# Patient Record
Sex: Male | Born: 1995 | Race: Black or African American | Hispanic: No | Marital: Single | Smoking: Current some day smoker
Health system: Southern US, Community
[De-identification: ages and names within clinical notes are randomized; demographics above are authoritative.]

## PROBLEM LIST (undated history)

## (undated) HISTORY — PX: FOOT SURGERY: SHX648

---
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
## 2017-05-14 ENCOUNTER — Encounter (HOSPITAL_COMMUNITY): Payer: Self-pay | Admitting: Emergency Medicine

## 2017-05-14 ENCOUNTER — Emergency Department (HOSPITAL_COMMUNITY): Payer: Managed Care, Other (non HMO)

## 2017-05-14 ENCOUNTER — Emergency Department (HOSPITAL_COMMUNITY)
Admission: EM | Admit: 2017-05-14 | Discharge: 2017-05-14 | Disposition: A | Discharge status: Home / Self Care | Payer: Managed Care, Other (non HMO) | Attending: Emergency Medicine | Admitting: Emergency Medicine

## 2017-05-14 DIAGNOSIS — R079 Chest pain, unspecified: Secondary | ICD-10-CM | POA: Insufficient documentation

## 2017-05-14 DIAGNOSIS — F17200 Nicotine dependence, unspecified, uncomplicated: Secondary | ICD-10-CM | POA: Insufficient documentation

## 2017-05-14 NOTE — ED Notes (Signed)
Pt updated on plan of care,  

## 2017-05-14 NOTE — ED Triage Notes (Signed)
Pt c/o upper pain to chest that is sharp, hurts worse with deep breathing. Started coughing today that is dry per pt.

## 2017-05-24 NOTE — ED Provider Notes (Signed)
[facility]-EMERGENCY DEPT Provider Note   CSN: [id number] Arrival date & time: 05/14/17  1827     History   Chief Complaint Chief Complaint  Patient presents with  . Chest Pain    HPI Donald Miranda is a 21 y.o. male.  20ym with CP. Gradual onset a couple days ago. Sharp. Brie.f lasts seconds. Worse with deep breathing and coughing. Doesn't feel sob though. Cough nonproductive. No fever or chills. No unusual leg pain or swelling. hasn't noticed positional changes. Denies hx of pe/dvt.    Chest Pain      History reviewed. No pertinent past medical history.  There are no active problems to display for this patient.   History reviewed. No pertinent surgical history.     Home Medications    Prior to Admission medications   Medication Sig Start Date End Date Taking? Authorizing Provider  acetaminophen-codeine (TYLENOL #3) 300-30 MG per tablet Take 1-2 tablets by mouth every 6 (six) hours as needed for moderate pain. 09/17/14   Ivery QualeBryant, Hobson, PA-C  amoxicillin (AMOXIL) 500 MG capsule Take 1 capsule (500 mg total) by mouth 3 (three) times daily. 09/17/14   Ivery QualeBryant, Hobson, PA-C  sodium-potassium bicarbonate (ALKA-SELTZER GOLD) TBEF dissolvable tablet Take 2 tablets by mouth daily as needed (aches and pain).    [provider]    Family History History reviewed. No pertinent family history.  Social History Social History  Substance Use Topics  . Smoking status: Current Every Day Smoker  . Smokeless tobacco: Never Used  . Alcohol use No     Allergies   Patient has no known allergies.   Review of Systems Review of Systems  Cardiovascular: Positive for chest pain.     Physical Exam Updated Vital Signs BP 119/61 (BP Location: Right Arm)   Pulse (!) 53   Temp 97.7 F (36.5 C) (Oral)   Resp 16   SpO2 100%   Physical Exam  Constitutional: He appears well-developed and well-nourished. No distress.  HENT:  Head: Normocephalic and atraumatic.    Eyes: Conjunctivae are normal. Right eye exhibits no discharge. Left eye exhibits no discharge.  Neck: Neck supple.  Cardiovascular: Normal rate, regular rhythm and normal heart sounds.  Exam reveals no gallop and no friction rub.   No murmur heard. Pulmonary/Chest: Effort normal and breath sounds normal. No respiratory distress.  Abdominal: Soft. He exhibits no distension. There is no tenderness.  Musculoskeletal: He exhibits no edema or tenderness.  Lower extremities symmetric as compared to each other. No calf tenderness. Negative Homan's. No palpable cords.   Neurological: He is alert.  Skin: Skin is warm and dry.  Psychiatric: He has a normal mood and affect. His behavior is normal. Thought content normal.  Nursing note and vitals reviewed.    ED Treatments / Results  Labs (all labs ordered are listed, but only abnormal results are displayed) Labs Reviewed - No data to display  EKG  EKG Interpretation  Date/Time:  Tuesday May 14 2017 18:35:35 EDT Ventricular Rate:  47 PR Interval:  140 QRS Duration: 102 QT Interval:  410 QTC Calculation: 362 R Axis:   88 Text Interpretation:  Sinus bradycardia Otherwise normal ECG Confirmed by Juleen ChinaKOHUT  MD, Jeannett SeniorSTEPHEN ([id number]) on 05/14/2017 8:53:23 PM       Radiology No results found.  Procedures Procedures (including critical care time)  Medications Ordered in ED Medications - No data to display   Initial Impression / Assessment and Plan / ED Course  I have reviewed  the triage vital signs and the nursing notes.  Pertinent labs & imaging results that were available during my care of the patient were reviewed by me and considered in my medical decision making (see chart for details).     20ym with cp. I doubt pe, acs, dissection or other emergent process. Atypical for acs. Not tachy/tachypneic or signs/symptoms of dvt. No fever. Denies dyspnea.   Final Clinical Impressions(s) / ED Diagnoses   Final diagnoses:  Chest pain,  unspecified type    New Prescriptions Discharge Medication List as of 05/14/2017  8:54 PM       Raeford Razor, MD 05/24/17 1127

## 2017-06-03 ENCOUNTER — Encounter (HOSPITAL_COMMUNITY): Payer: Self-pay | Admitting: Emergency Medicine

## 2017-06-03 ENCOUNTER — Emergency Department (HOSPITAL_COMMUNITY)
Admission: EM | Admit: 2017-06-03 | Discharge: 2017-06-03 | Disposition: A | Discharge status: Home / Self Care | Payer: Self-pay | Attending: Emergency Medicine | Admitting: Emergency Medicine

## 2017-06-03 DIAGNOSIS — F17200 Nicotine dependence, unspecified, uncomplicated: Secondary | ICD-10-CM | POA: Insufficient documentation

## 2017-06-03 DIAGNOSIS — J029 Acute pharyngitis, unspecified: Secondary | ICD-10-CM | POA: Insufficient documentation

## 2017-06-03 MED ORDER — MAGIC MOUTHWASH W/LIDOCAINE: 5.0000 mL | Freq: Three times a day (TID) | ORAL | 0 refills | Status: DC | PRN

## 2017-06-03 MED ORDER — AMOXICILLIN 500 MG PO CAPS: 500.0000 mg | ORAL_CAPSULE | Freq: Three times a day (TID) | ORAL | 0 refills | Status: DC

## 2017-06-03 NOTE — ED Notes (Signed)
Pt states understanding of care given and follow up instructions.  Pt a/o ambulated from ED with steady gait 

## 2017-06-03 NOTE — ED Triage Notes (Signed)
Patient complaining of sore throat x 4 days.

## 2017-06-03 NOTE — ED Provider Notes (Signed)
[facility]-EMERGENCY DEPT Provider Note   CSN: [id number] Arrival date & time: 06/03/17  1718     History   Chief Complaint Chief Complaint  Patient presents with  . Sore Throat    HPI Donald Miranda is a 21 y.o. male.  HPI   Donald Miranda is a 21 y.o. male who presents to the Emergency Department complaining of sore throat for 4 days.  He describes pain with swallowing and "white patches and bumps" to the back of his throat.  His symptoms are associated with generalized body aches.  He denies fever, abd pain, cough, and rash.  He has not tried any therapies or medications prior to arrival.  History reviewed. No pertinent past medical history.  There are no active problems to display for this patient.   History reviewed. No pertinent surgical history.     Home Medications    Prior to Admission medications   Medication Sig Start Date End Date Taking? Authorizing Provider  acetaminophen-codeine (TYLENOL #3) 300-30 MG per tablet Take 1-2 tablets by mouth every 6 (six) hours as needed for moderate pain. 09/17/14   Ivery QualeBryant, Hobson, PA-C  amoxicillin (AMOXIL) 500 MG capsule Take 1 capsule (500 mg total) by mouth 3 (three) times daily. 06/03/17   Bea Duren, PA-C  magic mouthwash w/lidocaine SOLN Take 5 mLs by mouth 3 (three) times daily as needed for mouth pain. Swish and spit, do not swallow 06/03/17   Saleema Weppler, PA-C  sodium-potassium bicarbonate (ALKA-SELTZER GOLD) TBEF dissolvable tablet Take 2 tablets by mouth daily as needed (aches and pain).    [provider]    Family History History reviewed. No pertinent family history.  Social History Social History  Substance Use Topics  . Smoking status: Current Every Day Smoker  . Smokeless tobacco: Never Used  . Alcohol use Yes     Comment: occasionally     Allergies   Patient has no known allergies.   Review of Systems Review of Systems  Constitutional: Negative for activity change, appetite  change, chills and fever.  HENT: Positive for sore throat. Negative for congestion, ear pain, facial swelling, trouble swallowing and voice change.   Eyes: Negative for pain and visual disturbance.  Respiratory: Negative for cough and shortness of breath.   Gastrointestinal: Negative for abdominal pain, nausea and vomiting.  Musculoskeletal: Negative for arthralgias, neck pain and neck stiffness.  Skin: Negative for color change and rash.  Neurological: Negative for dizziness, facial asymmetry, speech difficulty, numbness and headaches.  Hematological: Negative for adenopathy.  All other systems reviewed and are negative.    Physical Exam Updated Vital Signs BP 125/70 (BP Location: Right Arm)   Pulse 69   Temp 98 F (36.7 C) (Oral)   Resp 16   Ht 6\' 2"  (1.88 m)   Wt 79.9 kg (176 lb 2 oz)   SpO2 100%   BMI 22.61 kg/m   Physical Exam  Constitutional: He is oriented to person, place, and time. He appears well-developed and well-nourished. No distress.  HENT:  Head: Normocephalic and atraumatic.  Right Ear: Tympanic membrane and ear canal normal.  Left Ear: Tympanic membrane and ear canal normal.  Mouth/Throat: Uvula is midline and mucous membranes are normal. No trismus in the jaw. No uvula swelling. Posterior oropharyngeal edema and posterior oropharyngeal erythema present. No oropharyngeal exudate or tonsillar abscesses.  Neck: Normal range of motion. Neck supple.  Cardiovascular: Normal rate, regular rhythm and normal heart sounds.   Pulmonary/Chest: Effort normal and  breath sounds normal.  Abdominal: Soft. He exhibits no distension. There is no splenomegaly. There is no tenderness.  Musculoskeletal: Normal range of motion.  Lymphadenopathy:    He has no cervical adenopathy.  Neurological: He is alert and oriented to person, place, and time. He exhibits normal muscle tone. Coordination normal.  Skin: Skin is warm and dry. No rash noted.  Nursing note and vitals  reviewed.    ED Treatments / Results  Labs (all labs ordered are listed, but only abnormal results are displayed) Labs Reviewed - No data to display  EKG  EKG Interpretation None       Radiology No results found.  Procedures Procedures (including critical care time)  Medications Ordered in ED Medications - No data to display   Initial Impression / Assessment and Plan / ED Course  I have reviewed the triage vital signs and the nursing notes.  Pertinent labs & imaging results that were available during my care of the patient were reviewed by me and considered in my medical decision making (see chart for details).     Pt non-toxic appearing.  Vitals stable.  Handles secretions well.  No concerning sx's for PTA.  Appears stable for d/c.  Agrees to tx plan and return precautions given.  Final Clinical Impressions(s) / ED Diagnoses   Final diagnoses:  Sore throat    New Prescriptions New Prescriptions   AMOXICILLIN (AMOXIL) 500 MG CAPSULE    Take 1 capsule (500 mg total) by mouth 3 (three) times daily.   MAGIC MOUTHWASH W/LIDOCAINE SOLN    Take 5 mLs by mouth 3 (three) times daily as needed for mouth pain. Swish and spit, do not swallow     Pauline Aus, PA-C 06/05/17 1538    Mesner, Barbara Cower, MD 06/06/17 [phone]

## 2017-06-03 NOTE — Discharge Instructions (Signed)
Drink plenty of fluids.  Take one benadryl capsule every 4-6 hrs as needed.

## 2019-07-20 ENCOUNTER — Encounter (HOSPITAL_COMMUNITY): Payer: Self-pay | Admitting: Emergency Medicine

## 2019-07-20 ENCOUNTER — Other Ambulatory Visit: Payer: Self-pay

## 2019-07-20 ENCOUNTER — Emergency Department (HOSPITAL_COMMUNITY)
Admission: EM | Admit: 2019-07-20 | Discharge: 2019-07-20 | Disposition: A | Discharge status: Home / Self Care | Payer: Self-pay | Attending: Emergency Medicine | Admitting: Emergency Medicine

## 2019-07-20 DIAGNOSIS — R51 Headache: Secondary | ICD-10-CM | POA: Insufficient documentation

## 2019-07-20 DIAGNOSIS — G8929 Other chronic pain: Secondary | ICD-10-CM | POA: Insufficient documentation

## 2019-07-20 DIAGNOSIS — F17210 Nicotine dependence, cigarettes, uncomplicated: Secondary | ICD-10-CM | POA: Insufficient documentation

## 2019-07-20 DIAGNOSIS — Z79899 Other long term (current) drug therapy: Secondary | ICD-10-CM | POA: Insufficient documentation

## 2019-07-20 NOTE — ED Triage Notes (Signed)
Pt reports he had to leave work due to a headache he has had for months  His mother told him not to take pills for headache every day  (ibuprofen 600 mg daily)  Was at work and had headache to R temporal area and into eye and jaw  Here for eval and work note

## 2019-07-20 NOTE — Discharge Instructions (Signed)
You were seen in the ED today for headaches for the last 4 years.  This may be related to taking ibuprofen daily.  You can have what is called a rebound headache.  It is recommended that you trying to stop taking any kind of pain medication to see if your headaches will resolve over time.  Please do not take more than the recommended daily amount of ibuprofen or Tylenol.  Is recommended that you take these with food if need be.  Please follow-up with your primary care physician.  If you do not have one he can follow-up with either the [facility] clinic or the [facility] here in town.  You can also follow-up with [facility] in [place].

## 2019-07-20 NOTE — ED Provider Notes (Signed)
[facility] EMERGENCY DEPARTMENT Provider Note   CSN: [id number] Arrival date & time: 07/20/19  1347    History   Chief Complaint Chief Complaint  Patient presents with  . Headache    HPI Donald Miranda is a 23 y.o. male with no pertinent past medical history who presents to the ED today complaining of intermittent right-sided headaches for the last 4 years.  He has been taking 600 mg ibuprofen daily for the headaches.  He states that sometimes this resolves his headache but other times he will go to sleep and wake up with the same headache.  Patient was at work today and began experiencing the same headache.  He reports he left work and came to the ED because he wanted to know what was causing his headaches.  Patient is concerned that he is taking so much ibuprofen as he read on [employer] that this is not good for him.  Patient states that he thought that his headaches were caused from smoking marijuana.  He was in jail for the last 11 months and states he did not smoke any marijuana during that.  But continued to have headaches.  States that he typically has photophobia with his headaches as well as nausea and sometimes vomiting.  He has never been evaluated for this in the past.  Patient reports that his headache is present currently but it is very mild as he has already taken 800 mg ibuprofen today.  Denies fever, chills, neck stiffness, rash, vision changes, unilateral weakness or numbness, speech difficulties.       History reviewed. No pertinent past medical history.  There are no active problems to display for this patient.   History reviewed. No pertinent surgical history.      Home Medications    Prior to Admission medications   Medication Sig Start Date End Date Taking? Authorizing Provider  acetaminophen-codeine (TYLENOL #3) 300-30 MG per tablet Take 1-2 tablets by mouth every 6 (six) hours as needed for moderate pain. 09/17/14   Lily Kocher, PA-C  amoxicillin  (AMOXIL) 500 MG capsule Take 1 capsule (500 mg total) by mouth 3 (three) times daily. 06/03/17   Triplett, Tammy, PA-C  magic mouthwash w/lidocaine SOLN Take 5 mLs by mouth 3 (three) times daily as needed for mouth pain. Swish and spit, do not swallow 06/03/17   Triplett, Tammy, PA-C  sodium-potassium bicarbonate (ALKA-SELTZER GOLD) TBEF dissolvable tablet Take 2 tablets by mouth daily as needed (aches and pain).    [provider]    Family History No family history on file.  Social History Social History   Tobacco Use  . Smoking status: Current Every Day Smoker  . Smokeless tobacco: Never Used  Substance Use Topics  . Alcohol use: Yes    Comment: occasionally  . Drug use: Yes    Types: Marijuana     Allergies   Patient has no known allergies.   Review of Systems Review of Systems  Constitutional: Negative for chills and fever.  Eyes: Positive for photophobia. Negative for pain and visual disturbance.  Gastrointestinal: Positive for nausea and vomiting.  Musculoskeletal: Negative for neck pain and neck stiffness.  Skin: Negative for rash.  Neurological: Positive for headaches. Negative for dizziness, weakness, light-headedness and numbness.     Physical Exam Updated Vital Signs BP 125/72 (BP Location: Right Arm)   Pulse 71   Temp 99.3 F (37.4 C) (Oral)   Resp (!) 66   Ht 6\' 1"  (1.854 m)  Wt 99.8 kg   SpO2 100%   BMI 29.03 kg/m   Physical Exam Vitals signs and nursing note reviewed.  Constitutional:      Appearance: He is not ill-appearing.  HENT:     Head: Normocephalic and atraumatic.  Eyes:     Conjunctiva/sclera: Conjunctivae normal.  Neck:     Musculoskeletal: Neck supple.     Comments: Negative Brudzinski's and Kernig sign. Cardiovascular:     Rate and Rhythm: Normal rate and regular rhythm.  Pulmonary:     Effort: Pulmonary effort is normal.     Breath sounds: Normal breath sounds. No wheezing, rhonchi or rales.  Abdominal:      Palpations: Abdomen is soft.     Tenderness: There is no abdominal tenderness.  Musculoskeletal: Normal range of motion.  Skin:    General: Skin is warm and dry.  Neurological:     Mental Status: He is alert.     Comments: CN 3-12 grossly intact A&O x4 GCS 15 Sensation and strength intact Gait nonataxic including with tandem walking Coordination with finger-to-nose WNL Neg romberg, neg pronator drift       ED Treatments / Results  Labs (all labs ordered are listed, but only abnormal results are displayed) Labs Reviewed - No data to display  EKG None  Radiology No results found.  Procedures Procedures (including critical care time)  Medications Ordered in ED Medications - No data to display   Initial Impression / Assessment and Plan / ED Course  I have reviewed the triage vital signs and the nursing notes.  Pertinent labs & imaging results that were available during my care of the patient were reviewed by me and considered in my medical decision making (see chart for details).    23 year old otherwise healthy male who presents with persistent headaches for the last 4 years.  Patient reports that he has this headache daily.  He takes about 600-800 mg ibuprofen daily and is concerned.  Patient had a headache earlier today and already took 800 mg ibuprofen.  His headaches may be related to taking analgesics every day.  Question rebound headache.  States his headache is still present but very mild in nature.  Do not feel he needs headache cocktail today.  He has no focal neuro deficits on exam.  No red flag symptoms to suggest intracranial process.  Patient able to ambulate without difficulty.  Normal finger-to-nose.  Negative pronator drift.  Do not feel patient needs any additional work-up today.  He will need to follow-up with primary care physician regarding his persistent headaches.  Will give resource for primary care follow-up today.  Patient advised to try to not take  ibuprofen or Tylenol for these headaches as this might be caused from rebound headaches.  Suggest he try not taking these for about 1 to 2 weeks and see if his headaches improve.  Strict return precautions discussed.  Patient is in agreement with plan.  Stable for discharge at this time      Final Clinical Impressions(s) / ED Diagnoses   Final diagnoses:  Chronic nonintractable headache, unspecified headache type    ED Discharge Orders    None       Tanda RockersVenter, Quinlee Sciarra, PA-C 07/20/19 2205    Eber HongMiller, Brian, MD 07/24/19 [phone]

## 2019-07-20 NOTE — ED Notes (Signed)
Pt is non photophobic   Ambulates heel to toe without stagger or drift   Is conversant and lucid

## 2019-07-20 NOTE — ED Notes (Signed)
PA at bedside.

## 2019-08-05 ENCOUNTER — Emergency Department (HOSPITAL_COMMUNITY): Payer: Self-pay

## 2019-08-05 ENCOUNTER — Emergency Department (HOSPITAL_COMMUNITY)
Admission: EM | Admit: 2019-08-05 | Discharge: 2019-08-05 | Disposition: A | Discharge status: Home / Self Care | Payer: Self-pay | Attending: Emergency Medicine | Admitting: Emergency Medicine

## 2019-08-05 ENCOUNTER — Encounter (HOSPITAL_COMMUNITY): Payer: Self-pay

## 2019-08-05 ENCOUNTER — Other Ambulatory Visit: Payer: Self-pay

## 2019-08-05 DIAGNOSIS — F17200 Nicotine dependence, unspecified, uncomplicated: Secondary | ICD-10-CM | POA: Insufficient documentation

## 2019-08-05 DIAGNOSIS — J069 Acute upper respiratory infection, unspecified: Secondary | ICD-10-CM | POA: Insufficient documentation

## 2019-08-05 DIAGNOSIS — Z20828 Contact with and (suspected) exposure to other viral communicable diseases: Secondary | ICD-10-CM | POA: Insufficient documentation

## 2019-08-05 LAB — SARS CORONAVIRUS 2 (TAT 6-24 HRS): SARS Coronavirus 2: NEGATIVE

## 2019-08-05 MED ORDER — NAPROXEN 500 MG PO TABS: 500.0000 mg | ORAL_TABLET | Freq: Two times a day (BID) | ORAL | 0 refills | Status: DC

## 2019-08-05 MED ORDER — BENZONATATE 100 MG PO CAPS: 100.0000 mg | ORAL_CAPSULE | Freq: Three times a day (TID) | ORAL | 0 refills | Status: DC

## 2019-08-05 MED ORDER — ALBUTEROL SULFATE HFA 108 (90 BASE) MCG/ACT IN AERS: 2.0000 | INHALATION_SPRAY | RESPIRATORY_TRACT | 0 refills | Status: DC | PRN

## 2019-08-05 NOTE — Discharge Instructions (Addendum)
Your xray is normal Your throat is red - this looks like a viral infection - this means there is no antidote but it should get better over the next week. Stay out of work until you are fever free and feeling better with less cough, no sore throat - until then you are contagious. Your Covid test will take a couple of days to come back. Tylenol or Motrin for fever Please take Tessalon 100mg  by mouth 3 times daily for cough as needed. Please take Naprosyn, 500mg  by mouth twice daily as needed for pain - this in an antiinflammatory medicine (NSAID) and is similar to ibuprofen - many people feel that it is stronger than ibuprofen and it is easier to take since it is a smaller pill.  Please use this only for 1 week - if your pain persists, you will need to follow up with your doctor in the office for ongoing guidance and pain control.     ER for worsening symptoms.  [facility] Primary Care Doctor List    Sinda Du MD. Specialty: Pulmonary Disease Contact information: [place]  [place] [address]  [phone]   Tula Nakayama, MD. Specialty: [facility] Medicine Contact information: [address] [place] [address]  [phone]   Sallee Lange, MD. Specialty: [facility] Medicine Contact information: [place]  [place] [address]  [phone]   Rosita Fire, MD Specialty: Internal Medicine Contact information: [place] [address]  [phone]   Delphina Cahill, MD. Specialty: Internal Medicine Contact information: [place] [address]  [phone]    [facility] (Dr. Maudie Mercury) Specialty: Family Medicine Contact information: [place] [address]  [phone]   Leslie Andrea, MD. Specialty: [facility] Medicine Contact information: [place] [address]  [phone]   Asencion Noble, MD. Specialty: Internal Medicine Contact information: [address]  [place] [address]  [place]  [address] [place], [place] [address] [phone]  Services [facility] offers a variety of basic health services.  Services include but are not limited to: Blood pressure checks  Heart rate checks  Blood sugar checks  Urine analysis  Rapid strep tests  Pregnancy tests.  Health education and referrals  People needing more complex services will be directed to a physician online. Using these virtual visits, doctors can evaluate and prescribe medicine and treatments. There will be no medication on-site, though [facility] will help patients fill their prescriptions at little to no cost.   For More information please go to: [email or web address]

## 2019-08-05 NOTE — ED Triage Notes (Signed)
Pt has had a cough and sore throat that started yesterday. States he took his temperature this morning and it was 101.7. Afebrile here. 98.5.

## 2019-08-05 NOTE — ED Provider Notes (Signed)
[facility] EMERGENCY DEPARTMENT Provider Note   CSN: [id number] Arrival date & time: 08/05/19  9833     History   Chief Complaint Chief Complaint  Patient presents with  . Sore Throat  . Cough    HPI Donald Miranda is a 23 y.o. male.     HPI  This patient is an otherwise healthy 23 year old male, denies taking any daily medications except for as needed headache medicine.  He presents today with a complaint of coughing which is been going on for 2-1/2 days as well as a sore throat.  This morning he woke up with a fever of 101.8.  He took his headache medication and now feels better with regards to his fever but still feels sore throat.  He was told that he had an exposure at work to somebody who had K[place].  He denies any other symptoms.  He does report seeing some blood in the coughing mucus this morning  History reviewed. No pertinent past medical history.  There are no active problems to display for this patient.   History reviewed. No pertinent surgical history.      Home Medications    Prior to Admission medications   Medication Sig Start Date End Date Taking? Authorizing Provider  albuterol (VENTOLIN HFA) 108 (90 Base) MCG/ACT inhaler Inhale 2 puffs into the lungs every 4 (four) hours as needed for wheezing or shortness of breath. 08/05/19   Noemi Chapel, MD  benzonatate (TESSALON) 100 MG capsule Take 1 capsule (100 mg total) by mouth every 8 (eight) hours. 08/05/19   Noemi Chapel, MD  naproxen (NAPROSYN) 500 MG tablet Take 1 tablet (500 mg total) by mouth 2 (two) times daily with a meal. 08/05/19   Noemi Chapel, MD    Family History No family history on file.  Social History Social History   Tobacco Use  . Smoking status: Current Every Day Smoker    Packs/day: 1.00  . Smokeless tobacco: Never Used  Substance Use Topics  . Alcohol use: Yes    Comment: occasionally  . Drug use: Yes    Types: Marijuana     Allergies   Patient has no known allergies.    Review of Systems Review of Systems  Constitutional: Positive for fever.  HENT: Positive for sore throat.   Respiratory: Positive for cough.      Physical Exam Updated Vital Signs BP 115/70 (BP Location: Right Arm)   Pulse 73   Temp 98.5 F (36.9 C) (Oral)   Resp (!) 26   Ht 1.854 m (6\' 1" )   Wt 99.8 kg   SpO2 99%   BMI 29.03 kg/m   Physical Exam Vitals signs and nursing note reviewed.  Constitutional:      Appearance: He is well-developed. He is not diaphoretic.  HENT:     Head: Normocephalic and atraumatic.     Mouth/Throat:     Comments: Clear oropharynx, mildly erythematous, no exudate or asymmetry or hypertrophy Eyes:     General:        Right eye: No discharge.        Left eye: No discharge.     Conjunctiva/sclera: Conjunctivae normal.  Cardiovascular:     Rate and Rhythm: Normal rate and regular rhythm.     Heart sounds: No murmur.  Pulmonary:     Effort: Pulmonary effort is normal. No respiratory distress.     Breath sounds: No wheezing, rhonchi or rales.  Skin:    General: Skin is  warm and dry.     Findings: No erythema or rash.  Neurological:     Mental Status: He is alert.     Coordination: Coordination normal.      ED Treatments / Results  Labs (all labs ordered are listed, but only abnormal results are displayed) Labs Reviewed  SARS CORONAVIRUS 2 (TAT 6-24 HRS)    EKG None  Radiology Dg Chest Port 1 View  Result Date: 08/05/2019 CLINICAL DATA:  Productive cough.  Fever this morning. EXAM: PORTABLE CHEST 1 VIEW COMPARISON:  05/14/2017 FINDINGS: The heart size and mediastinal contours are within normal limits. Both lungs are clear. The visualized skeletal structures are unremarkable. IMPRESSION: Normal exam. Electronically Signed   By: Francene BoyersJames  Maxwell M.D.   On: 08/05/2019 08:13    Procedures Procedures (including critical care time)  Medications Ordered in ED Medications - No data to display   Initial Impression / Assessment and  Plan / ED Course  I have reviewed the triage vital signs and the nursing notes.  Pertinent labs & imaging results that were available during my care of the patient were reviewed by me and considered in my medical decision making (see chart for details).  Clinical Course as of Aug 05 819  Wed Aug 05, 2019  0757 Xray negative - I have personally viewed and interpreted the xray - no infiltrates - no PTX, well appearing and normal   [BM]    Clinical Course User Index [BM] Eber HongMiller, Shimeka Bacot, MD       Normal lung sounds, mildly erythematous throat, check for COVID, chest x-ray due to hemoptysis otherwise patient appears well with normal vital signs.  He is not tachypneic on my exam, afebrile, normotensive and a normal heart rate of 70.  X-ray normal, stable for discharge  Planned medications including anti-inflammatories, antitussives and bronchodilators  Final Clinical Impressions(s) / ED Diagnoses   Final diagnoses:  Viral upper respiratory tract infection    ED Discharge Orders         Ordered    naproxen (NAPROSYN) 500 MG tablet  2 times daily with meals     08/05/19 0753    benzonatate (TESSALON) 100 MG capsule  Every 8 hours     08/05/19 0753    albuterol (VENTOLIN HFA) 108 (90 Base) MCG/ACT inhaler  Every 4 hours PRN     08/05/19 0753           Eber HongMiller, Linder Prajapati, MD 08/05/19 [phone]

## 2019-09-09 ENCOUNTER — Other Ambulatory Visit: Payer: Self-pay

## 2019-09-09 DIAGNOSIS — Z20822 Contact with and (suspected) exposure to covid-19: Secondary | ICD-10-CM

## 2019-09-10 LAB — NOVEL CORONAVIRUS, NAA: SARS-CoV-2, NAA: NOT DETECTED

## 2019-09-22 ENCOUNTER — Emergency Department (HOSPITAL_COMMUNITY)
Admission: EM | Admit: 2019-09-22 | Discharge: 2019-09-22 | Disposition: A | Discharge status: Home / Self Care | Payer: Self-pay | Attending: Emergency Medicine | Admitting: Emergency Medicine

## 2019-09-22 ENCOUNTER — Other Ambulatory Visit: Payer: Self-pay

## 2019-09-22 DIAGNOSIS — R350 Frequency of micturition: Secondary | ICD-10-CM | POA: Insufficient documentation

## 2019-09-22 DIAGNOSIS — N5089 Other specified disorders of the male genital organs: Secondary | ICD-10-CM | POA: Insufficient documentation

## 2019-09-22 DIAGNOSIS — Z79899 Other long term (current) drug therapy: Secondary | ICD-10-CM | POA: Insufficient documentation

## 2019-09-22 DIAGNOSIS — F17210 Nicotine dependence, cigarettes, uncomplicated: Secondary | ICD-10-CM | POA: Insufficient documentation

## 2019-09-22 DIAGNOSIS — Z202 Contact with and (suspected) exposure to infections with a predominantly sexual mode of transmission: Secondary | ICD-10-CM | POA: Insufficient documentation

## 2019-09-22 LAB — URINALYSIS, ROUTINE W REFLEX MICROSCOPIC
Bilirubin Urine: NEGATIVE
Glucose, UA: NEGATIVE mg/dL
Hgb urine dipstick: NEGATIVE
Ketones, ur: NEGATIVE mg/dL
Leukocytes,Ua: NEGATIVE
Nitrite: NEGATIVE
Protein, ur: NEGATIVE mg/dL
Specific Gravity, Urine: 1.002 — ABNORMAL LOW (ref 1.005–1.030)
pH: 7 (ref 5.0–8.0)

## 2019-09-22 MED ORDER — AZITHROMYCIN 250 MG PO TABS
1000.0000 mg | ORAL_TABLET | Freq: Once | ORAL | Status: AC
  Administered 2019-09-22: 13:00:00 1000 mg via ORAL
  Filled 2019-09-22: qty 4

## 2019-09-22 MED ORDER — LIDOCAINE HCL (PF) 1 % IJ SOLN
INTRAMUSCULAR | Status: AC
  Administered 2019-09-22: 1 mL
  Filled 2019-09-22: qty 2

## 2019-09-22 MED ORDER — CEFTRIAXONE SODIUM 250 MG IJ SOLR
250.0000 mg | Freq: Once | INTRAMUSCULAR | Status: AC
  Administered 2019-09-22: 13:00:00 250 mg via INTRAMUSCULAR
  Filled 2019-09-22: qty 250

## 2019-09-22 NOTE — ED Provider Notes (Signed)
[facility] EMERGENCY DEPARTMENT Provider Note   CSN: [id number] Arrival date & time: 09/22/19  1130     History   Chief Complaint Chief Complaint  Patient presents with  . Urinary Frequency    HPI Donald Miranda is a 23 y.o. male with no significant past medical history who presents to the ED with sudden onset of burning to the tip of his penis that has been present for the past 3-4 days. Patient notes he has had unprotected sex with numerous partners over the past few weeks. He has not tried anything for his symptoms. He notes that the burning at the tip of his penis goes away with urination. He denies dysuria and flank pain. Patient denies a history of STIs. Patient denies penile lesions, penile discharge, fever, chills, chest pain, and shortness of breath.   No past medical history on file.  There are no active problems to display for this patient.   No past surgical history on file.      Home Medications    Prior to Admission medications   Medication Sig Start Date End Date Taking? Authorizing Provider  albuterol (VENTOLIN HFA) 108 (90 Base) MCG/ACT inhaler Inhale 2 puffs into the lungs every 4 (four) hours as needed for wheezing or shortness of breath. 08/05/19   Noemi Chapel, MD  benzonatate (TESSALON) 100 MG capsule Take 1 capsule (100 mg total) by mouth every 8 (eight) hours. 08/05/19   Noemi Chapel, MD  naproxen (NAPROSYN) 500 MG tablet Take 1 tablet (500 mg total) by mouth 2 (two) times daily with a meal. 08/05/19   Noemi Chapel, MD    Family History No family history on file.  Social History Social History   Tobacco Use  . Smoking status: Current Every Day Smoker    Packs/day: 1.00  . Smokeless tobacco: Never Used  Substance Use Topics  . Alcohol use: Yes    Comment: occasionally  . Drug use: Yes    Types: Marijuana     Allergies   Patient has no known allergies.   Review of Systems Review of Systems  Constitutional: Negative for chills and  fever.  Respiratory: Negative for shortness of breath.   Cardiovascular: Negative for chest pain.  Gastrointestinal: Negative for abdominal pain.  Genitourinary: Positive for penile pain. Negative for decreased urine volume, difficulty urinating, discharge, dysuria, flank pain, genital sores, hematuria, penile swelling, scrotal swelling and testicular pain.     Physical Exam Updated Vital Signs BP 132/68 (BP Location: Right Arm)   Pulse 70   Temp 98.7 F (37.1 C) (Oral)   Resp 15   Ht 6\' 1"  (1.854 m)   Wt 86.2 kg   SpO2 100%   BMI 25.07 kg/m   Physical Exam Vitals signs and nursing note reviewed. Exam conducted with a chaperone present.  Constitutional:      General: He is not in acute distress.    Appearance: He is not ill-appearing.  HENT:     Head: Normocephalic.  Neck:     Musculoskeletal: Normal range of motion and neck supple.  Cardiovascular:     Rate and Rhythm: Normal rate and regular rhythm.     Pulses: Normal pulses.     Heart sounds: Normal heart sounds.  Pulmonary:     Effort: Pulmonary effort is normal.     Breath sounds: Normal breath sounds. No wheezing, rhonchi or rales.  Abdominal:     General: Abdomen is flat. There is no distension.  Palpations: Abdomen is soft.     Tenderness: There is no abdominal tenderness. There is no right CVA tenderness, left CVA tenderness, guarding or rebound.  Genitourinary:    Penis: Normal and circumcised. No erythema, tenderness, discharge, swelling or lesions.      Scrotum/Testes: Normal.     Comments: Normal circumcised penis with no lesions, erythema, edema, tenderness. No discharge present. Testicles with no edema, erythema, or tenderness. Neurological:     Mental Status: He is alert.      ED Treatments / Results  Labs (all labs ordered are listed, but only abnormal results are displayed) Labs Reviewed  URINALYSIS, ROUTINE W REFLEX MICROSCOPIC - Abnormal; Notable for the following components:      Result  Value   Color, Urine COLORLESS (*)    Specific Gravity, Urine 1.002 (*)    All other components within normal limits  GC/CHLAMYDIA PROBE AMP ([place]) NOT AT [facility]    EKG None  Radiology No results found.  Procedures Procedures (including critical care time)  Medications Ordered in ED Medications  cefTRIAXone (ROCEPHIN) injection 250 mg (250 mg Intramuscular Given 09/22/19 1255)  azithromycin (ZITHROMAX) tablet 1,000 mg (1,000 mg Oral Given 09/22/19 1254)  lidocaine (PF) (XYLOCAINE) 1 % injection (1 mL  Given 09/22/19 1255)     Initial Impression / Assessment and Plan / ED Course  I have reviewed the triage vital signs and the nursing notes.  Pertinent labs & imaging results that were available during my care of the patient were reviewed by me and considered in my medical decision making (see chart for details).       Donald Miranda is a 23 year old male who presents to the ED for an evaluation of burning on the tip of his penis and concern for a STI. Upon arrival, vitals all within normal limits. On physical exam, patient is well appearing and in no acute distress. Normal circumcised penis with no tenderness, discharge, or lesions. Normal testicles with no edema or tenderness. Patient wants to be tested for chlamydia and gonorrhea, but deferred HIV and syphilis test. Urethra swab was obtained with chaperone in room. UA unremarkable for signs of infection. Shared decision making with patient about prophylaxis treatment vs. Wait until test comes back. Patient expressed desire to be treated today. Rocephin and Azithromycin given at the hospital. Patient given info for PCPs in the area. Strict ED precautions discussed with patient. Patient states understanding and agrees to plan. Patient discharged home in no acute distress and vitals within normal limits.  Final Clinical Impressions(s) / ED Diagnoses   Final diagnoses:  Possible exposure to STD    ED Discharge Orders     None       Donald Miranda 09/22/19 1346    Eber Hong, MD 09/23/19 [phone]

## 2019-09-22 NOTE — Discharge Instructions (Addendum)
If you test positive for gonorrhea or chlamydia you will receive a phone call. If you do not hear, you are negative. You have already been treated for both STIs today in the ER. I have also given you a number of a PCP. I recommend establishing care with them. Please return to the ED for new or worsening symptoms.
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
## 2019-09-23 LAB — GC/CHLAMYDIA PROBE AMP (~~LOC~~) NOT AT ARMC
Chlamydia: NEGATIVE
Neisseria Gonorrhea: NEGATIVE

## 2020-08-17 ENCOUNTER — Encounter (HOSPITAL_COMMUNITY): Payer: Self-pay

## 2020-08-17 ENCOUNTER — Other Ambulatory Visit: Payer: Self-pay

## 2020-08-17 ENCOUNTER — Emergency Department (HOSPITAL_COMMUNITY): Payer: Self-pay

## 2020-08-17 ENCOUNTER — Emergency Department (HOSPITAL_COMMUNITY)
Admission: EM | Admit: 2020-08-17 | Discharge: 2020-08-18 | Disposition: A | Discharge status: Home / Self Care | Payer: Self-pay | Attending: Emergency Medicine | Admitting: Emergency Medicine

## 2020-08-17 DIAGNOSIS — Z20822 Contact with and (suspected) exposure to covid-19: Secondary | ICD-10-CM | POA: Insufficient documentation

## 2020-08-17 DIAGNOSIS — F17200 Nicotine dependence, unspecified, uncomplicated: Secondary | ICD-10-CM | POA: Insufficient documentation

## 2020-08-17 DIAGNOSIS — J189 Pneumonia, unspecified organism: Secondary | ICD-10-CM

## 2020-08-17 DIAGNOSIS — R590 Localized enlarged lymph nodes: Secondary | ICD-10-CM | POA: Insufficient documentation

## 2020-08-17 DIAGNOSIS — J181 Lobar pneumonia, unspecified organism: Secondary | ICD-10-CM | POA: Insufficient documentation

## 2020-08-17 LAB — GROUP A STREP BY PCR: Group A Strep by PCR: NOT DETECTED

## 2020-08-17 LAB — SARS CORONAVIRUS 2 BY RT PCR (HOSPITAL ORDER, PERFORMED IN ~~LOC~~ HOSPITAL LAB): SARS Coronavirus 2: NEGATIVE

## 2020-08-17 MED ORDER — IBUPROFEN 800 MG PO TABS
800.0000 mg | ORAL_TABLET | Freq: Once | ORAL | Status: AC
  Administered 2020-08-17: 800 mg via ORAL
  Filled 2020-08-17: qty 1

## 2020-08-17 NOTE — ED Provider Notes (Signed)
[name] EMERGENCY DEPARTMENT Provider Note   CSN: [id number] Arrival date & time: 08/17/20  1402     History Chief Complaint  Patient presents with  . Neck Pain  . Cough  . Sore Throat    Donald Miranda is a 24 y.o. male with no significant past medical history presenting with a 2-day history of URI type symptoms including cough which has been nonproductive along with a severe sore throat making it difficult to eat and drink.  He has had subjective fever including hot flashes and cold chills.  He also describes neck pain which is worsened with cough and swallowing.  He has had no nausea or vomiting, denies abdominal pain, denies shortness of breath.  His pain is described as sharp and stabbing.  He denies rash, headache, photophobia.  He has applied BenGay to his neck, has also taken Jerilynn Som for his cough and his mother gave him an amoxicillin tablet this evening.  None of these treatments have improved his symptoms.  He denies any exposures to persons with similar symptoms.  The history is provided by the patient.       History reviewed. No pertinent past medical history.  There are no problems to display for this patient.   History reviewed. No pertinent surgical history.     History reviewed. No pertinent family history.  Social History   Tobacco Use  . Smoking status: Current Every Day Smoker    Packs/day: 1.00  . Smokeless tobacco: Never Used  Vaping Use  . Vaping Use: Never used  Substance Use Topics  . Alcohol use: Yes    Comment: occasionally  . Drug use: Yes    Types: Marijuana    Home Medications Prior to Admission medications   Medication Sig Start Date End Date Taking? Authorizing Provider  albuterol (VENTOLIN HFA) 108 (90 Base) MCG/ACT inhaler Inhale 2 puffs into the lungs every 4 (four) hours as needed for wheezing or shortness of breath. 08/05/19   Eber Hong, MD  benzonatate (TESSALON) 100 MG capsule Take 1 capsule (100 mg total)  by mouth every 8 (eight) hours. 08/05/19   Eber Hong, MD  doxycycline (VIBRAMYCIN) 100 MG capsule Take 1 capsule (100 mg total) by mouth 2 (two) times daily. 08/18/20   Burgess Amor, PA-C  naproxen (NAPROSYN) 500 MG tablet Take 1 tablet (500 mg total) by mouth 2 (two) times daily with a meal. 08/05/19   Eber Hong, MD  promethazine-codeine [facility] WITH CODEINE) 6.25-10 MG/5ML syrup Take 5 mLs by mouth every 4 (four) hours as needed for cough (and chest pain). 08/18/20   Burgess Amor, PA-C    Allergies    Patient has no known allergies.  Review of Systems   Review of Systems  Constitutional: Positive for chills and fever.  HENT: Positive for sore throat. Negative for congestion, ear pain, rhinorrhea, sinus pressure, trouble swallowing and voice change.   Eyes: Negative for discharge.  Respiratory: Positive for cough. Negative for shortness of breath, wheezing and stridor.   Cardiovascular: Positive for chest pain.  Gastrointestinal: Negative for abdominal pain, nausea and vomiting.  Genitourinary: Negative.   Skin: Negative.  Negative for rash.  All other systems reviewed and are negative.   Physical Exam Updated Vital Signs BP 102/67 (BP Location: Right Arm)   Pulse 88   Temp 99 F (37.2 C) (Oral)   Resp 17   Ht 6\' 1"  (1.854 m)   Wt 81.6 kg   SpO2 99%   BMI  23.75 kg/m   Physical Exam Constitutional:      General: He is not in acute distress.    Appearance: He is well-developed.  HENT:     Head: Normocephalic and atraumatic.     Right Ear: Tympanic membrane and ear canal normal.     Left Ear: Tympanic membrane and ear canal normal.     Nose: Mucosal edema present. No rhinorrhea.     Mouth/Throat:     Mouth: Mucous membranes are moist.     Pharynx: Uvula midline. Posterior oropharyngeal erythema present. No oropharyngeal exudate.     Tonsils: No tonsillar exudate or tonsillar abscesses.  Eyes:     Conjunctiva/sclera: Conjunctivae normal.  Cardiovascular:     Rate  and Rhythm: Normal rate.     Heart sounds: Normal heart sounds. No murmur heard.   Pulmonary:     Effort: Pulmonary effort is normal. No respiratory distress.     Breath sounds: Examination of the right-middle field reveals rhonchi. Rhonchi present. No wheezing or rales.  Musculoskeletal:        General: Normal range of motion.     Cervical back: Neck supple.  Lymphadenopathy:     Cervical: Cervical adenopathy present.  Skin:    General: Skin is warm and dry.     Findings: No rash.  Neurological:     Mental Status: He is alert and oriented to person, place, and time.     ED Results / Procedures / Treatments   Labs (all labs ordered are listed, but only abnormal results are displayed) Labs Reviewed  SARS CORONAVIRUS 2 BY RT PCR [facility] ORDER, PERFORMED IN [facility] LAB)  GROUP A STREP BY PCR    EKG EKG Interpretation  Date/Time:  Wednesday August 17 2020 21:35:40 EDT Ventricular Rate:  90 PR Interval:  142 QRS Duration: 92 QT Interval:  324 QTC Calculation: 396 R Axis:   83 Text Interpretation: Normal sinus rhythm Normal ECG Confirmed by Cherlynn Perches ([id number]) on 08/18/2020 9:41:36 AM   Radiology DG Chest 2 View  Result Date: 08/17/2020 CLINICAL DATA:  Cough and chest pain EXAM: CHEST - 2 VIEW COMPARISON:  08/05/2019 FINDINGS: Right upper lobe pneumonia. No pleural effusion. Normal heart size. No pneumothorax. IMPRESSION: Right upper lobe pneumonia. Electronically Signed   By: Jasmine Pang M.D.   On: 08/17/2020 23:10    Procedures Procedures (including critical care time)  Medications Ordered in ED Medications  ibuprofen (ADVIL) tablet 800 mg (800 mg Oral Given 08/17/20 2234)  doxycycline (VIBRA-TABS) tablet 100 mg (100 mg Oral Given 08/18/20 0044)  albuterol (VENTOLIN HFA) 108 (90 Base) MCG/ACT inhaler 2 puff (2 puffs Inhalation Given 08/18/20 0013)    ED Course  I have reviewed the triage vital signs and the nursing notes.  Pertinent labs & imaging  results that were available during my care of the patient were reviewed by me and considered in my medical decision making (see chart for details).    MDM Rules/Calculators/A&P                          Labs and imaging reviewed and discussed with pt.  CAP with chest pain but no respiratory distress.  Port score low risk.  Prescribed doxycycline, first dose here. Albuterol, phenergan/codeine for cough and chest pain, albuterol mdi given.  Strict return precautions discussed.  Final Clinical Impression(s) / ED Diagnoses Final diagnoses:  Community acquired pneumonia of right upper lobe of lung  Rx / DC Orders ED Discharge Orders         Ordered    doxycycline (VIBRAMYCIN) 100 MG capsule  2 times daily        08/18/20 0004    promethazine-codeine (PHENERGAN WITH CODEINE) 6.25-10 MG/5ML syrup  Every 4 hours PRN        08/18/20 0004           Burgess Amor, PA-C 08/19/20 2033    Long, Arlyss Repress, MD 08/22/20 [phone]

## 2020-08-17 NOTE — ED Triage Notes (Signed)
Pt to er, pt states that he is here for neck pain, sore throat, and a cough, pt states that it hurts to move his neck.  States that the neck pain started yesterday and the sore throat started today.  Denies exposure to covid.  Denies vaccination.

## 2020-08-18 MED ORDER — PROMETHAZINE-CODEINE 6.25-10 MG/5ML PO SYRP: 5.0000 mL | ORAL_SOLUTION | ORAL | 0 refills | Status: DC | PRN

## 2020-08-18 MED ORDER — ALBUTEROL SULFATE HFA 108 (90 BASE) MCG/ACT IN AERS
2.0000 | INHALATION_SPRAY | Freq: Once | RESPIRATORY_TRACT | Status: AC
  Administered 2020-08-18: 2 via RESPIRATORY_TRACT
  Filled 2020-08-18: qty 6.7

## 2020-08-18 MED ORDER — DOXYCYCLINE HYCLATE 100 MG PO TABS
100.0000 mg | ORAL_TABLET | Freq: Once | ORAL | Status: AC
  Administered 2020-08-18: 100 mg via ORAL
  Filled 2020-08-18: qty 1

## 2020-08-18 MED ORDER — DOXYCYCLINE HYCLATE 100 MG PO CAPS: 100.0000 mg | ORAL_CAPSULE | Freq: Two times a day (BID) | ORAL | 0 refills | Status: DC

## 2020-08-18 NOTE — ED Notes (Signed)
Pt ambulatory to waiting room. Pt verbalized understanding of discharge instructions.   

## 2020-08-18 NOTE — Discharge Instructions (Addendum)
Rest make sure you are drinking plenty of fluids.  Take the entire course of the antibiotic prescribed.  You may also take the cough syrup prescribed, this will also help you with your throat and chest pain.  I also recommend continuing ibuprofen which will also help with throat pain.  Your strep test and your Covid test are negative today.  Get rechecked for any worsening symptoms, particularly weakness or shortness of breath.

## 2020-09-20 ENCOUNTER — Emergency Department (HOSPITAL_COMMUNITY): Payer: Self-pay

## 2020-09-20 ENCOUNTER — Encounter (HOSPITAL_COMMUNITY): Payer: Self-pay | Admitting: *Deleted

## 2020-09-20 ENCOUNTER — Emergency Department (HOSPITAL_COMMUNITY)
Admission: EM | Admit: 2020-09-20 | Discharge: 2020-09-20 | Disposition: A | Discharge status: Home / Self Care | Payer: Self-pay | Attending: Emergency Medicine | Admitting: Emergency Medicine

## 2020-09-20 ENCOUNTER — Other Ambulatory Visit: Payer: Self-pay

## 2020-09-20 ENCOUNTER — Other Ambulatory Visit: Payer: Self-pay | Admitting: Orthopedic Surgery

## 2020-09-20 DIAGNOSIS — Z20822 Contact with and (suspected) exposure to covid-19: Secondary | ICD-10-CM | POA: Insufficient documentation

## 2020-09-20 DIAGNOSIS — Z23 Encounter for immunization: Secondary | ICD-10-CM | POA: Insufficient documentation

## 2020-09-20 DIAGNOSIS — S92311B Displaced fracture of first metatarsal bone, right foot, initial encounter for open fracture: Secondary | ICD-10-CM | POA: Insufficient documentation

## 2020-09-20 DIAGNOSIS — W3400XA Accidental discharge from unspecified firearms or gun, initial encounter: Secondary | ICD-10-CM | POA: Insufficient documentation

## 2020-09-20 DIAGNOSIS — F17210 Nicotine dependence, cigarettes, uncomplicated: Secondary | ICD-10-CM | POA: Insufficient documentation

## 2020-09-20 LAB — CBC WITH DIFFERENTIAL/PLATELET
Abs Immature Granulocytes: 0.02 10*3/uL (ref 0.00–0.07)
Basophils Absolute: 0 10*3/uL (ref 0.0–0.1)
Basophils Relative: 1 %
Eosinophils Absolute: 0.2 10*3/uL (ref 0.0–0.5)
Eosinophils Relative: 3 %
HCT: 43.2 % (ref 39.0–52.0)
Hemoglobin: 14.7 g/dL (ref 13.0–17.0)
Immature Granulocytes: 0 %
Lymphocytes Relative: 28 %
Lymphs Abs: 2.2 10*3/uL (ref 0.7–4.0)
MCH: 30.9 pg (ref 26.0–34.0)
MCHC: 34 g/dL (ref 30.0–36.0)
MCV: 90.9 fL (ref 80.0–100.0)
Monocytes Absolute: 0.5 10*3/uL (ref 0.1–1.0)
Monocytes Relative: 6 %
Neutro Abs: 5 10*3/uL (ref 1.7–7.7)
Neutrophils Relative %: 62 %
Platelets: 334 10*3/uL (ref 150–400)
RBC: 4.75 MIL/uL (ref 4.22–5.81)
RDW: 13.8 % (ref 11.5–15.5)
WBC: 8 10*3/uL (ref 4.0–10.5)
nRBC: 0 % (ref 0.0–0.2)

## 2020-09-20 LAB — COMPREHENSIVE METABOLIC PANEL
ALT: 15 U/L (ref 0–44)
AST: 18 U/L (ref 15–41)
Albumin: 4.3 g/dL (ref 3.5–5.0)
Alkaline Phosphatase: 83 U/L (ref 38–126)
Anion gap: 9 (ref 5–15)
BUN: 11 mg/dL (ref 6–20)
CO2: 23 mmol/L (ref 22–32)
Calcium: 9.2 mg/dL (ref 8.9–10.3)
Chloride: 104 mmol/L (ref 98–111)
Creatinine, Ser: 0.98 mg/dL (ref 0.61–1.24)
GFR, Estimated: >60 mL/min (ref >60)
Glucose, Bld: 99 mg/dL (ref 70–99)
Potassium: 3.8 mmol/L (ref 3.5–5.1)
Sodium: 136 mmol/L (ref 135–145)
Total Bilirubin: 0.8 mg/dL (ref 0.3–1.2)
Total Protein: 8.1 g/dL (ref 6.5–8.1)

## 2020-09-20 LAB — RESPIRATORY PANEL BY RT PCR (FLU A&B, COVID)
Influenza A by PCR: NEGATIVE
Influenza B by PCR: NEGATIVE
SARS Coronavirus 2 by RT PCR: NEGATIVE

## 2020-09-20 LAB — PROTIME-INR
INR: 1 (ref 0.8–1.2)
Prothrombin Time: 12.6 seconds (ref 11.4–15.2)

## 2020-09-20 MED ORDER — DOXYCYCLINE HYCLATE 100 MG PO CAPS: 100.0000 mg | ORAL_CAPSULE | Freq: Two times a day (BID) | ORAL | 0 refills | Status: AC

## 2020-09-20 MED ORDER — OXYCODONE-ACETAMINOPHEN 5-325 MG PO TABS
1.0000 | ORAL_TABLET | Freq: Four times a day (QID) | ORAL | 0 refills | Status: DC | PRN
Start: 2020-09-20 — End: 2020-09-23

## 2020-09-20 MED ORDER — ONDANSETRON HCL 4 MG/2ML IJ SOLN
4.0000 mg | Freq: Once | INTRAMUSCULAR | Status: AC
  Administered 2020-09-20: 4 mg via INTRAVENOUS
  Filled 2020-09-20: qty 2

## 2020-09-20 MED ORDER — CEFAZOLIN SODIUM-DEXTROSE 2-4 GM/100ML-% IV SOLN
2.0000 g | Freq: Once | INTRAVENOUS | Status: AC
  Administered 2020-09-20: 2 g via INTRAVENOUS
  Filled 2020-09-20: qty 100

## 2020-09-20 MED ORDER — MORPHINE SULFATE (PF) 4 MG/ML IV SOLN
4.0000 mg | Freq: Once | INTRAVENOUS | Status: AC
  Administered 2020-09-20: 4 mg via INTRAVENOUS
  Filled 2020-09-20: qty 1

## 2020-09-20 MED ORDER — LACTATED RINGERS IV BOLUS
1000.0000 mL | Freq: Once | INTRAVENOUS | Status: AC
  Administered 2020-09-20: 1000 mL via INTRAVENOUS

## 2020-09-20 MED ORDER — TETANUS-DIPHTH-ACELL PERTUSSIS 5-2.5-18.5 LF-MCG/0.5 IM SUSP
0.5000 mL | Freq: Once | INTRAMUSCULAR | Status: AC
  Administered 2020-09-20: 0.5 mL via INTRAMUSCULAR
  Filled 2020-09-20: qty 0.5

## 2020-09-20 NOTE — ED Notes (Signed)
[facility] speaking with pt while in triage.
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
## 2020-09-21 ENCOUNTER — Encounter (HOSPITAL_COMMUNITY)
Admission: RE | Admit: 2020-09-21 | Discharge: 2020-09-21 | Disposition: A | Discharge status: Home / Self Care | Payer: Self-pay | Source: Ambulatory Visit | Attending: Orthopedic Surgery | Admitting: Orthopedic Surgery

## 2020-09-21 ENCOUNTER — Telehealth: Payer: Self-pay | Admitting: Radiology

## 2020-09-21 ENCOUNTER — Other Ambulatory Visit: Payer: Self-pay

## 2020-09-21 ENCOUNTER — Ambulatory Visit (INDEPENDENT_AMBULATORY_CARE_PROVIDER_SITE_OTHER): Payer: Self-pay | Admitting: Orthopedic Surgery

## 2020-09-21 ENCOUNTER — Encounter: Payer: Self-pay | Admitting: Orthopedic Surgery

## 2020-09-21 VITALS — BP 159/80 | HR 126 | Ht 72.0 in | Wt 193.0 lb

## 2020-09-21 DIAGNOSIS — S91331A Puncture wound without foreign body, right foot, initial encounter: Secondary | ICD-10-CM

## 2020-09-21 LAB — TYPE AND SCREEN
ABO/RH(D): AB POS
Antibody Screen: NEGATIVE
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
## 2020-09-21 NOTE — Telephone Encounter (Signed)
-----   Message from [employer] May, RT sent at 09/21/2020 10:11 AM EDT ----- Regarding: CPT Code needed Renee with [facility] called needing CPT code for this patient.  Please call her [phone], ext [phone].
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
## 2020-09-21 NOTE — Telephone Encounter (Signed)
I called her back left message

## 2020-09-21 NOTE — Patient Instructions (Signed)
Elevate the foot   Take ibuprofen 800 every 6 hrs and the pain medicine from the ER as ordered

## 2020-09-21 NOTE — Pre-Procedure Instructions (Signed)
Left voicemail for patient and Cala Bradford, mother, to call back for PAT over phone.

## 2020-09-22 ENCOUNTER — Other Ambulatory Visit: Payer: Self-pay

## 2020-09-22 ENCOUNTER — Encounter (HOSPITAL_COMMUNITY): Payer: Self-pay

## 2020-09-23 ENCOUNTER — Ambulatory Visit (HOSPITAL_COMMUNITY): Payer: Self-pay | Admitting: Certified Registered"

## 2020-09-23 ENCOUNTER — Encounter (HOSPITAL_COMMUNITY): Payer: Self-pay | Admitting: Orthopedic Surgery

## 2020-09-23 ENCOUNTER — Ambulatory Visit (HOSPITAL_COMMUNITY)
Admission: RE | Admit: 2020-09-23 | Discharge: 2020-09-23 | Disposition: A | Discharge status: Home / Self Care | Payer: Self-pay | Attending: Orthopedic Surgery | Admitting: Orthopedic Surgery

## 2020-09-23 ENCOUNTER — Encounter (HOSPITAL_COMMUNITY)
Admission: RE | Disposition: A | Discharge status: Home / Self Care | Payer: Self-pay | Source: Home / Self Care | Attending: Orthopedic Surgery

## 2020-09-23 ENCOUNTER — Other Ambulatory Visit: Payer: Self-pay

## 2020-09-23 DIAGNOSIS — Z1810 Retained metal fragments, unspecified: Secondary | ICD-10-CM

## 2020-09-23 DIAGNOSIS — W3400XA Accidental discharge from unspecified firearms or gun, initial encounter: Secondary | ICD-10-CM | POA: Insufficient documentation

## 2020-09-23 DIAGNOSIS — S92314B Nondisplaced fracture of first metatarsal bone, right foot, initial encounter for open fracture: Secondary | ICD-10-CM

## 2020-09-23 DIAGNOSIS — Z791 Long term (current) use of non-steroidal anti-inflammatories (NSAID): Secondary | ICD-10-CM | POA: Insufficient documentation

## 2020-09-23 DIAGNOSIS — F17210 Nicotine dependence, cigarettes, uncomplicated: Secondary | ICD-10-CM | POA: Insufficient documentation

## 2020-09-23 DIAGNOSIS — S91301D Unspecified open wound, right foot, subsequent encounter: Secondary | ICD-10-CM

## 2020-09-23 DIAGNOSIS — S92311A Displaced fracture of first metatarsal bone, right foot, initial encounter for closed fracture: Secondary | ICD-10-CM | POA: Insufficient documentation

## 2020-09-23 DIAGNOSIS — S91331A Puncture wound without foreign body, right foot, initial encounter: Secondary | ICD-10-CM | POA: Insufficient documentation

## 2020-09-23 DIAGNOSIS — S92314D Nondisplaced fracture of first metatarsal bone, right foot, subsequent encounter for fracture with routine healing: Secondary | ICD-10-CM

## 2020-09-23 HISTORY — PX: INCISION AND DRAINAGE WOUND WITH FOREIGN BODY REMOVAL: SHX5635

## 2020-09-23 SURGERY — INCISION AND DRAINAGE WOUND WITH FOREIGN BODY REMOVAL
Anesthesia: General | Site: Foot | Laterality: Right

## 2020-09-23 MED ORDER — OXYCODONE-ACETAMINOPHEN 5-325 MG PO TABS: 1.0000 | ORAL_TABLET | ORAL | 0 refills | Status: AC | PRN

## 2020-09-23 MED ORDER — IBUPROFEN 800 MG PO TABS
800.0000 mg | ORAL_TABLET | Freq: Once | ORAL | Status: AC
  Administered 2020-09-23: 800 mg via ORAL
  Filled 2020-09-23: qty 1

## 2020-09-23 MED ORDER — ONDANSETRON HCL 4 MG/2ML IJ SOLN
4.0000 mg | Freq: Once | INTRAMUSCULAR | Status: AC
  Administered 2020-09-23: 4 mg via INTRAVENOUS
  Filled 2020-09-23: qty 2

## 2020-09-23 MED ORDER — MIDAZOLAM HCL 2 MG/2ML IJ SOLN
INTRAMUSCULAR | Status: DC | PRN
  Administered 2020-09-23: 2 mg via INTRAVENOUS

## 2020-09-23 MED ORDER — PROPOFOL 10 MG/ML IV BOLUS
INTRAVENOUS | Status: DC | PRN
  Administered 2020-09-23 (×2): 20 mg via INTRAVENOUS
  Administered 2020-09-23: 300 mg via INTRAVENOUS

## 2020-09-23 MED ORDER — ONDANSETRON HCL 4 MG/2ML IJ SOLN
INTRAMUSCULAR | Status: AC
  Filled 2020-09-23: qty 2

## 2020-09-23 MED ORDER — FENTANYL CITRATE (PF) 250 MCG/5ML IJ SOLN
INTRAMUSCULAR | Status: AC
  Filled 2020-09-23: qty 5

## 2020-09-23 MED ORDER — HYDROMORPHONE HCL 1 MG/ML IJ SOLN: 0.2500 mg | INTRAMUSCULAR | Status: DC | PRN

## 2020-09-23 MED ORDER — MIDAZOLAM HCL 2 MG/2ML IJ SOLN
INTRAMUSCULAR | Status: AC
  Filled 2020-09-23: qty 2

## 2020-09-23 MED ORDER — OXYCODONE-ACETAMINOPHEN 5-325 MG PO TABS
1.0000 | ORAL_TABLET | ORAL | Status: DC | PRN
  Administered 2020-09-23: 1 via ORAL
  Filled 2020-09-23: qty 1

## 2020-09-23 MED ORDER — FENTANYL CITRATE (PF) 100 MCG/2ML IJ SOLN
INTRAMUSCULAR | Status: DC | PRN
  Administered 2020-09-23 (×5): 50 ug via INTRAVENOUS
  Administered 2020-09-23: 100 ug via INTRAVENOUS

## 2020-09-23 MED ORDER — CHLORHEXIDINE GLUCONATE 0.12 % MT SOLN
15.0000 mL | Freq: Once | OROMUCOSAL | Status: AC
  Administered 2020-09-23: 15 mL via OROMUCOSAL

## 2020-09-23 MED ORDER — ORAL CARE MOUTH RINSE: 15.0000 mL | Freq: Once | OROMUCOSAL | Status: AC

## 2020-09-23 MED ORDER — BUPIVACAINE-EPINEPHRINE (PF) 0.5% -1:200000 IJ SOLN
INTRAMUSCULAR | Status: AC
  Filled 2020-09-23: qty 30

## 2020-09-23 MED ORDER — LACTATED RINGERS IV SOLN: INTRAVENOUS | Status: DC

## 2020-09-23 MED ORDER — CEFAZOLIN SODIUM-DEXTROSE 2-4 GM/100ML-% IV SOLN
2.0000 g | INTRAVENOUS | Status: AC
  Administered 2020-09-23: 2 g via INTRAVENOUS

## 2020-09-23 MED ORDER — POVIDONE-IODINE 10 % EX SWAB: 2.0000 "application " | Freq: Once | CUTANEOUS | Status: DC

## 2020-09-23 MED ORDER — ONDANSETRON HCL 4 MG/2ML IJ SOLN
INTRAMUSCULAR | Status: DC | PRN
  Administered 2020-09-23: 4 mg via INTRAVENOUS

## 2020-09-23 MED ORDER — CHLORHEXIDINE GLUCONATE 4 % EX LIQD: 60.0000 mL | Freq: Once | CUTANEOUS | Status: DC

## 2020-09-23 MED ORDER — CHLORHEXIDINE GLUCONATE 0.12 % MT SOLN
OROMUCOSAL | Status: AC
  Filled 2020-09-23: qty 15

## 2020-09-23 MED ORDER — PROMETHAZINE HCL 12.5 MG PO TABS: 12.5000 mg | ORAL_TABLET | Freq: Four times a day (QID) | ORAL | 0 refills | Status: DC | PRN

## 2020-09-23 MED ORDER — PROPOFOL 10 MG/ML IV BOLUS
INTRAVENOUS | Status: AC
  Filled 2020-09-23: qty 40

## 2020-09-23 MED ORDER — SODIUM CHLORIDE 0.9 % IR SOLN
Status: DC | PRN
  Administered 2020-09-23: 1000 mL

## 2020-09-23 MED ORDER — FENTANYL CITRATE (PF) 100 MCG/2ML IJ SOLN
INTRAMUSCULAR | Status: AC
  Filled 2020-09-23: qty 2

## 2020-09-23 MED ORDER — ONDANSETRON HCL 4 MG/2ML IJ SOLN: 4.0000 mg | Freq: Once | INTRAMUSCULAR | Status: DC | PRN

## 2020-09-23 MED ORDER — IBUPROFEN 800 MG PO TABS: 800.0000 mg | ORAL_TABLET | Freq: Three times a day (TID) | ORAL | 1 refills | Status: DC | PRN

## 2020-09-23 MED ORDER — BUPIVACAINE-EPINEPHRINE (PF) 0.5% -1:200000 IJ SOLN
INTRAMUSCULAR | Status: DC | PRN
  Administered 2020-09-23: 30 mL via PERINEURAL

## 2020-09-23 SURGICAL SUPPLY — 41 items
BANDAGE ELASTIC 4 VELCRO NS (GAUZE/BANDAGES/DRESSINGS) ×3 IMPLANT
BANDAGE ELASTIC 4 VELCRO ST LF (GAUZE/BANDAGES/DRESSINGS) ×3 IMPLANT
BNDG GAUZE ELAST 4 BULKY (GAUZE/BANDAGES/DRESSINGS) ×3 IMPLANT
CLOTH BEACON ORANGE TIMEOUT ST (SAFETY) ×3 IMPLANT
COVER LIGHT HANDLE STERIS (MISCELLANEOUS) ×6 IMPLANT
COVER WAND RF STERILE (DRAPES) ×3 IMPLANT
CUFF TOURN SGL QUICK 24 (TOURNIQUET CUFF) ×3
CUFF TRNQT CYL 24X4X16.5-23 (TOURNIQUET CUFF) ×1 IMPLANT
DRSG XEROFORM 1X8 (GAUZE/BANDAGES/DRESSINGS) ×3 IMPLANT
ELECT REM PT RETURN 9FT ADLT (ELECTROSURGICAL) ×3
ELECTRODE REM PT RTRN 9FT ADLT (ELECTROSURGICAL) ×1 IMPLANT
GAUZE SPONGE 4X4 12PLY STRL (GAUZE/BANDAGES/DRESSINGS) ×3 IMPLANT
GAUZE XEROFORM 5X9 LF (GAUZE/BANDAGES/DRESSINGS) ×3 IMPLANT
GLOVE BIO SURGEON STRL SZ7 (GLOVE) ×3 IMPLANT
GLOVE BIOGEL PI IND STRL 6.5 (GLOVE) ×1 IMPLANT
GLOVE BIOGEL PI IND STRL 7.0 (GLOVE) ×1 IMPLANT
GLOVE BIOGEL PI INDICATOR 6.5 (GLOVE) ×2
GLOVE BIOGEL PI INDICATOR 7.0 (GLOVE) ×2
GLOVE ECLIPSE 6.5 STRL STRAW (GLOVE) ×3 IMPLANT
GLOVE SKINSENSE NS SZ8.0 LF (GLOVE) ×2
GLOVE SKINSENSE STRL SZ8.0 LF (GLOVE) ×1 IMPLANT
GLOVE SS N UNI LF 8.5 STRL (GLOVE) ×3 IMPLANT
GOWN STRL REUS W/TWL LRG LVL3 (GOWN DISPOSABLE) ×9 IMPLANT
GOWN STRL REUS W/TWL XL LVL3 (GOWN DISPOSABLE) ×3 IMPLANT
KIT TURNOVER KIT A (KITS) ×3 IMPLANT
MANIFOLD NEPTUNE II (INSTRUMENTS) ×3 IMPLANT
NS IRRIG 1000ML POUR BTL (IV SOLUTION) ×3 IMPLANT
PACK BASIC LIMB (CUSTOM PROCEDURE TRAY) ×3 IMPLANT
PAD ABD 5X9 TENDERSORB (GAUZE/BANDAGES/DRESSINGS) ×3 IMPLANT
PAD ARMBOARD 7.5X6 YLW CONV (MISCELLANEOUS) ×3 IMPLANT
PAD CAST 3X4 CTTN HI CHSV (CAST SUPPLIES) ×1 IMPLANT
PAD CAST 4YDX4 CTTN HI CHSV (CAST SUPPLIES) ×1 IMPLANT
PADDING CAST COTTON 3X4 STRL (CAST SUPPLIES) ×3
PADDING CAST COTTON 4X4 STRL (CAST SUPPLIES) ×3
PADDING WEBRIL 4 STERILE (GAUZE/BANDAGES/DRESSINGS) ×3 IMPLANT
PUTTY DBX 5CC (Putty) ×3 IMPLANT
SET BASIN LINEN APH (SET/KITS/TRAYS/PACK) ×3 IMPLANT
SUT ETHILON 3 0 FSL (SUTURE) ×6 IMPLANT
SUT MON AB 2-0 SH 27 (SUTURE) ×3
SUT MON AB 2-0 SH27 (SUTURE) ×1 IMPLANT
SYR BULB IRRIG 60ML STRL (SYRINGE) ×3 IMPLANT

## 2020-09-23 NOTE — Anesthesia Postprocedure Evaluation (Signed)
Anesthesia Post Note  Patient: Donald Miranda  Procedure(s) Performed: INCISION AND DRAINAGE WOUND with bone graft of first metatarsal (Right Foot)  Patient location during evaluation: PACU Anesthesia Type: General Level of consciousness: awake and alert and patient cooperative Pain management: satisfactory to patient Vital Signs Assessment: post-procedure vital signs reviewed and stable Respiratory status: spontaneous breathing Cardiovascular status: stable Postop Assessment: no apparent nausea or vomiting Anesthetic complications: no   No complications documented.   Last Vitals:  Vitals:   09/23/20 0915 09/23/20 0933  BP: 131/86 (!) 144/92  Pulse: 93 81  Resp: 19 18  Temp:  36.6 C  SpO2: 96% 100%    Last Pain:  Vitals:   09/23/20 0933  TempSrc: Oral  PainSc: 5                  Lowen Mansouri
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
## 2020-09-23 NOTE — Anesthesia Procedure Notes (Signed)
Procedure Name: LMA Insertion Date/Time: 09/23/2020 7:34 AM Performed by: Franco Nones, CRNA Pre-anesthesia Checklist: Patient identified, Patient being monitored, Emergency Drugs available, Timeout performed and Suction available Patient Re-evaluated:Patient Re-evaluated prior to induction Oxygen Delivery Method: Circle System Utilized Preoxygenation: Pre-oxygenation with 100% oxygen Induction Type: IV induction LMA: LMA inserted LMA Size: 4.0 Number of attempts: 1 Placement Confirmation: positive ETCO2 and breath sounds checked- equal and bilateral Tube secured with: Tape Dental Injury: Teeth and Oropharynx as per pre-operative assessment

## 2020-09-23 NOTE — Transfer of Care (Signed)
Immediate Anesthesia Transfer of Care Note  Patient: DREXEL IVEY  Procedure(s) Performed: INCISION AND DRAINAGE WOUND with bone graft of first metatarsal (Right Foot)  Patient Location: PACU  Anesthesia Type:General  Level of Consciousness: awake and patient cooperative  Airway & Oxygen Therapy: Patient Spontanous Breathing  Post-op Assessment: Report given to RN and Post -op Vital signs reviewed and stable  Post vital signs: Reviewed and stable  Last Vitals:  Vitals Value Taken Time  BP 129/66 09/23/20 0846  Temp 98.1   Pulse 88 09/23/20 0847  Resp 19 09/23/20 0847  SpO2 100 % 09/23/20 0847  Vitals shown include unvalidated device data.  Last Pain: There were no vitals filed for this visit.       Complications: No complications documented.

## 2020-09-23 NOTE — Anesthesia Preprocedure Evaluation (Signed)
Anesthesia Evaluation  Patient identified by MRN, date of birth, ID band Patient awake    Reviewed: Allergy & Precautions, H&P , NPO status , Patient's Chart, lab work & pertinent test results, reviewed documented beta blocker date and time   Airway Mallampati: II  TM Distance: >3 FB Neck ROM: full    Dental no notable dental hx. (+) Teeth Intact   Pulmonary neg pulmonary ROS, Current Smoker and Patient abstained from smoking.,    Pulmonary exam normal breath sounds clear to auscultation       Cardiovascular Exercise Tolerance: Good negative cardio ROS   Rhythm:regular Rate:Normal     Neuro/Psych negative neurological ROS  negative psych ROS   GI/Hepatic negative GI ROS, Neg liver ROS,   Endo/Other  negative endocrine ROS  Renal/GU negative Renal ROS  negative genitourinary   Musculoskeletal   Abdominal   Peds  Hematology negative hematology ROS (+)   Anesthesia Other Findings   Reproductive/Obstetrics negative OB ROS                             Anesthesia Physical Anesthesia Plan  ASA: I  Anesthesia Plan: General   Post-op Pain Management:    Induction:   PONV Risk Score and Plan: Ondansetron  Airway Management Planned:   Additional Equipment:   Intra-op Plan:   Post-operative Plan:   Informed Consent: I have reviewed the patients History and Physical, chart, labs and discussed the procedure including the risks, benefits and alternatives for the proposed anesthesia with the patient or authorized representative who has indicated his/her understanding and acceptance.     Dental Advisory Given  Plan Discussed with: CRNA  Anesthesia Plan Comments:         Anesthesia Quick Evaluation
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
## 2020-09-23 NOTE — Discharge Instructions (Signed)
General Anesthesia, Adult, Care After This sheet gives you information about how to care for yourself after your procedure. Your health care provider may also give you more specific instructions. If you have problems or questions, contact your health care provider. What can I expect after the procedure? After the procedure, the following side effects are common:  Pain or discomfort at the IV site.  Nausea.  Vomiting.  Sore throat.  Trouble concentrating.  Feeling cold or chills.  Weak or tired.  Sleepiness and fatigue.  Soreness and body aches. These side effects can affect parts of the body that were not involved in surgery. Follow these instructions at home:  For at least 24 hours after the procedure:  Have a responsible adult stay with you. It is important to have someone help care for you until you are awake and alert.  Rest as needed.  Do not: ? Participate in activities in which you could fall or become injured. ? Drive. ? Use heavy machinery. ? Drink alcohol. ? Take sleeping pills or medicines that cause drowsiness. ? Make important decisions or sign legal documents. ? Take care of children on your own. Eating and drinking  Follow any instructions from your health care provider about eating or drinking restrictions.  When you feel hungry, start by eating small amounts of foods that are soft and easy to digest (bland), such as toast. Gradually return to your regular diet.  Drink enough fluid to keep your urine pale yellow.  If you vomit, rehydrate by drinking water, juice, or clear broth. General instructions  If you have sleep apnea, surgery and certain medicines can increase your risk for breathing problems. Follow instructions from your health care provider about wearing your sleep device: ? Anytime you are sleeping, including during daytime naps. ? While taking prescription pain medicines, sleeping medicines, or medicines that make you drowsy.  Return to  your normal activities as told by your health care provider. Ask your health care provider what activities are safe for you.  Take over-the-counter and prescription medicines only as told by your health care provider.  If you smoke, do not smoke without supervision.  Keep all follow-up visits as told by your health care provider. This is important. Contact a health care provider if:  You have nausea or vomiting that does not get better with medicine.  You cannot eat or drink without vomiting.  You have pain that does not get better with medicine.  You are unable to pass urine.  You develop a skin rash.  You have a fever.  You have redness around your IV site that gets worse. Get help right away if:  You have difficulty breathing.  You have chest pain.  You have blood in your urine or stool, or you vomit blood. Summary  After the procedure, it is common to have a sore throat or nausea. It is also common to feel tired.  Have a responsible adult stay with you for the first 24 hours after general anesthesia. It is important to have someone help care for you until you are awake and alert.  When you feel hungry, start by eating small amounts of foods that are soft and easy to digest (bland), such as toast. Gradually return to your regular diet.  Drink enough fluid to keep your urine pale yellow.  Return to your normal activities as told by your health care provider. Ask your health care provider what activities are safe for you. This information is not   intended to replace advice given to you by your health care provider. Make sure you discuss any questions you have with your health care provider. Document Revised: 11/22/2017 Document Reviewed: 07/05/2017 Elsevier Patient Education  2020 [employer]  
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
## 2020-09-26 ENCOUNTER — Other Ambulatory Visit: Payer: Self-pay

## 2020-09-26 ENCOUNTER — Ambulatory Visit (INDEPENDENT_AMBULATORY_CARE_PROVIDER_SITE_OTHER): Payer: Self-pay | Admitting: Orthopedic Surgery

## 2020-09-26 ENCOUNTER — Encounter (HOSPITAL_COMMUNITY): Payer: Self-pay | Admitting: Orthopedic Surgery

## 2020-09-26 DIAGNOSIS — S91331D Puncture wound without foreign body, right foot, subsequent encounter: Secondary | ICD-10-CM
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
## 2020-09-26 NOTE — Patient Instructions (Signed)
Keep up and elevated   No weight on the foot

## 2020-10-10 ENCOUNTER — Encounter: Payer: Self-pay | Admitting: Orthopedic Surgery

## 2020-10-10 ENCOUNTER — Ambulatory Visit (INDEPENDENT_AMBULATORY_CARE_PROVIDER_SITE_OTHER): Payer: Self-pay | Admitting: Orthopedic Surgery

## 2020-10-10 ENCOUNTER — Other Ambulatory Visit: Payer: Self-pay

## 2020-10-10 ENCOUNTER — Ambulatory Visit: Payer: Self-pay

## 2020-10-10 VITALS — BP 111/73 | HR 79 | Resp 16 | Ht 73.0 in | Wt 193.0 lb

## 2020-10-10 DIAGNOSIS — S91331D Puncture wound without foreign body, right foot, subsequent encounter: Secondary | ICD-10-CM

## 2020-10-10 DIAGNOSIS — Z4889 Encounter for other specified surgical aftercare: Secondary | ICD-10-CM

## 2020-10-10 DIAGNOSIS — G8918 Other acute postprocedural pain: Secondary | ICD-10-CM

## 2020-10-10 MED ORDER — IBUPROFEN 800 MG PO TABS: 800.0000 mg | ORAL_TABLET | Freq: Three times a day (TID) | ORAL | 1 refills | Status: DC | PRN

## 2020-10-10 MED ORDER — HYDROCODONE-ACETAMINOPHEN 7.5-325 MG PO TABS: 1.0000 | ORAL_TABLET | Freq: Four times a day (QID) | ORAL | 0 refills | Status: DC | PRN
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
## 2020-11-04 DIAGNOSIS — S91331A Puncture wound without foreign body, right foot, initial encounter: Secondary | ICD-10-CM | POA: Insufficient documentation

## 2020-11-07 ENCOUNTER — Encounter: Payer: Self-pay | Admitting: Orthopedic Surgery

## 2020-11-07 ENCOUNTER — Ambulatory Visit (INDEPENDENT_AMBULATORY_CARE_PROVIDER_SITE_OTHER): Payer: Self-pay | Admitting: Orthopedic Surgery

## 2020-11-07 ENCOUNTER — Other Ambulatory Visit: Payer: Self-pay

## 2020-11-07 ENCOUNTER — Telehealth: Payer: Self-pay | Admitting: Orthopedic Surgery

## 2020-11-07 ENCOUNTER — Ambulatory Visit: Payer: Self-pay

## 2020-11-07 DIAGNOSIS — S91331D Puncture wound without foreign body, right foot, subsequent encounter: Secondary | ICD-10-CM

## 2020-11-07 MED ORDER — HYDROCODONE-ACETAMINOPHEN 5-325 MG PO TABS: 1.0000 | ORAL_TABLET | Freq: Four times a day (QID) | ORAL | 0 refills | Status: DC | PRN
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
## 2020-11-07 NOTE — Patient Instructions (Signed)
Wear boot weight bearing as tolertaed   Take ibuprofen and I ordered some hydrocodone take 1 every 6 hrs as needed for pain

## 2020-11-07 NOTE — Telephone Encounter (Signed)
Patient came back following visit - states [facility] is out of his medication prescribed by Dr Romeo Apple today; states he was not advised how long it may take to come. Change to another pharmacy?

## 2020-11-08 NOTE — Telephone Encounter (Signed)
I called pharmacy it is there now and ready for pickup she did call the patient to let him know.

## 2020-12-05 ENCOUNTER — Ambulatory Visit: Payer: Self-pay | Admitting: Orthopedic Surgery

## 2020-12-15 ENCOUNTER — Ambulatory Visit: Payer: Self-pay | Admitting: Orthopedic Surgery

## 2020-12-16 ENCOUNTER — Encounter: Payer: Self-pay | Admitting: Orthopedic Surgery

## 2021-02-06 ENCOUNTER — Other Ambulatory Visit: Payer: Self-pay

## 2021-02-06 ENCOUNTER — Ambulatory Visit (INDEPENDENT_AMBULATORY_CARE_PROVIDER_SITE_OTHER): Payer: Self-pay | Admitting: Orthopedic Surgery

## 2021-02-06 ENCOUNTER — Ambulatory Visit: Payer: Self-pay

## 2021-02-06 DIAGNOSIS — S91331D Puncture wound without foreign body, right foot, subsequent encounter: Secondary | ICD-10-CM

## 2021-02-06 MED ORDER — CEPHALEXIN 500 MG PO CAPS: 500.0000 mg | ORAL_CAPSULE | Freq: Two times a day (BID) | ORAL | 0 refills | Status: DC

## 2021-02-06 MED ORDER — INDOMETHACIN 25 MG PO CAPS: 25.0000 mg | ORAL_CAPSULE | Freq: Three times a day (TID) | ORAL | 0 refills | Status: DC

## 2021-02-06 MED ORDER — CEPHALEXIN 500 MG PO CAPS
500.0000 mg | ORAL_CAPSULE | Freq: Two times a day (BID) | ORAL | 0 refills | Status: DC
Start: 2021-02-06 — End: 2021-12-11
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
## 2021-02-27 ENCOUNTER — Encounter: Payer: Self-pay | Admitting: Orthopedic Surgery

## 2021-02-27 ENCOUNTER — Ambulatory Visit (INDEPENDENT_AMBULATORY_CARE_PROVIDER_SITE_OTHER): Payer: Self-pay | Admitting: Orthopedic Surgery

## 2021-02-27 ENCOUNTER — Other Ambulatory Visit: Payer: Self-pay

## 2021-02-27 VITALS — BP 129/76 | HR 99 | Ht 73.0 in | Wt 190.0 lb

## 2021-02-27 DIAGNOSIS — S91331D Puncture wound without foreign body, right foot, subsequent encounter: Secondary | ICD-10-CM

## 2021-02-27 MED ORDER — MELOXICAM 7.5 MG PO TABS: 7.5000 mg | ORAL_TABLET | Freq: Two times a day (BID) | ORAL | 5 refills | Status: DC

## 2021-02-27 NOTE — Patient Instructions (Addendum)
Stop antibiotic  Stop pain med   Start melxocam take 2 x a day      Smoking Tobacco Information, Adult Smoking tobacco can be harmful to your health. Tobacco contains a poisonous (toxic), colorless chemical called nicotine. Nicotine is addictive. It changes the brain and can make it hard to stop smoking. Tobacco also has other toxic chemicals that can hurt your body and raise your risk of many cancers. How can smoking tobacco affect me? Smoking tobacco puts you at risk for:  Cancer. Smoking is most commonly associated with lung cancer, but can also lead to cancer in other parts of the body.  Chronic obstructive pulmonary disease (COPD). This is a long-term lung condition that makes it hard to breathe. It also gets worse over time.  High blood pressure (hypertension), heart disease, stroke, or heart attack.  Lung infections, such as pneumonia.  Cataracts. This is when the lenses in the eyes become clouded.  Digestive problems. This may include peptic ulcers, heartburn, and gastroesophageal reflux disease (GERD).  Oral health problems, such as gum disease and tooth loss.  Loss of taste and smell. Smoking can affect your appearance by causing:  Wrinkles.  Yellow or stained teeth, fingers, and fingernails. Smoking tobacco can also affect your social life, because:  It may be challenging to find places to smoke when away from home. Many workplaces, [employer], hotels, and public places are tobacco-free.  Smoking is expensive. This is due to the cost of tobacco and the long-term costs of treating health problems from smoking.  Secondhand smoke may affect those around you. Secondhand smoke can cause lung cancer, breathing problems, and heart disease. Children of smokers have a higher risk for: ? Sudden infant death syndrome (SIDS). ? Ear infections. ? Lung infections. If you currently smoke tobacco, quitting now can help you:  Lead a longer and healthier life.  Look, smell,  breathe, and feel better over time.  Save money.  Protect others from the harms of secondhand smoke. What actions can I take to prevent health problems? Quit smoking  Do not start smoking. Quit if you already do.  Make a plan to quit smoking and commit to it. Look for programs to help you and ask your health care provider for recommendations and ideas.  Set a date and write down all the reasons you want to quit.  Let your friends and family know you are quitting so they can help and support you. Consider finding friends who also want to quit. It can be easier to quit with someone else, so that you can support each other.  Talk with your health care provider about using nicotine replacement medicines to help you quit, such as gum, lozenges, patches, sprays, or pills.  Do not replace cigarette smoking with electronic cigarettes, which are commonly called e-cigarettes. The safety of e-cigarettes is not known, and some may contain harmful chemicals.  If you try to quit but return to smoking, stay positive. It is common to slip up when you first quit, so take it one day at a time.  Be prepared for cravings. When you feel the urge to smoke, chew gum or suck on hard candy.   Lifestyle  Stay busy and take care of your body.  Drink enough fluid to keep your urine pale yellow.  Get plenty of exercise and eat a healthy diet. This can help prevent weight gain after quitting.  Monitor your eating habits. Quitting smoking can cause you to have a larger appetite  than when you smoke.  Find ways to relax. Go out with friends or family to a movie or a restaurant where people do not smoke.  Ask your health care provider about having regular tests (screenings) to check for cancer. This may include blood tests, imaging tests, and other tests.  Find ways to manage your stress, such as meditation, yoga, or exercise. Where to find support To get support to quit smoking, consider:  Asking your health  care provider for more information and resources.  Taking classes to learn more about quitting smoking.  Looking for local organizations that offer resources about quitting smoking.  Joining a support group for people who want to quit smoking in your local community.  Calling the smokefree.gov counselor helpline: 1-800-Quit-Now 847-172-3612) Where to find more information You may find more information about quitting smoking from:  HelpGuide.org: www.helpguide.org  [employer]: smokefree.gov  American Lung Association: www.lung.org Contact a health care provider if you:  Have problems breathing.  Notice that your lips, nose, or fingers turn blue.  Have chest pain.  Are coughing up blood.  Feel faint or you pass out.  Have other health changes that cause you to worry. Summary  Smoking tobacco can negatively affect your health, the health of those around you, your finances, and your social life.  Do not start smoking. Quit if you already do. If you need help quitting, ask your health care provider.  Think about joining a support group for people who want to quit smoking in your local community. There are many effective programs that will help you to quit this behavior. This information is not intended to replace advice given to you by your health care provider. Make sure you discuss any questions you have with your health care provider. Document Revised: 08/14/2019 Document Reviewed: 12/04/2016 Elsevier Patient Education  2021 [employer].
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
## 2021-12-11 ENCOUNTER — Encounter (HOSPITAL_COMMUNITY): Payer: Self-pay

## 2021-12-11 ENCOUNTER — Emergency Department (HOSPITAL_COMMUNITY)
Admission: EM | Admit: 2021-12-11 | Discharge: 2021-12-11 | Disposition: A | Discharge status: Home / Self Care | Payer: Self-pay | Attending: Emergency Medicine | Admitting: Emergency Medicine

## 2021-12-11 ENCOUNTER — Emergency Department (HOSPITAL_COMMUNITY): Payer: Self-pay

## 2021-12-11 ENCOUNTER — Other Ambulatory Visit: Payer: Self-pay

## 2021-12-11 DIAGNOSIS — G43009 Migraine without aura, not intractable, without status migrainosus: Secondary | ICD-10-CM | POA: Insufficient documentation

## 2021-12-11 DIAGNOSIS — R7309 Other abnormal glucose: Secondary | ICD-10-CM | POA: Insufficient documentation

## 2021-12-11 DIAGNOSIS — Z87891 Personal history of nicotine dependence: Secondary | ICD-10-CM | POA: Insufficient documentation

## 2021-12-11 LAB — CBG MONITORING, ED: Glucose-Capillary: 101 mg/dL — ABNORMAL HIGH (ref 70–99)

## 2021-12-11 MED ORDER — KETOROLAC TROMETHAMINE 30 MG/ML IJ SOLN
30.0000 mg | Freq: Once | INTRAMUSCULAR | Status: AC
  Administered 2021-12-11: 30 mg via INTRAVENOUS
  Filled 2021-12-11: qty 1

## 2021-12-11 MED ORDER — LACTATED RINGERS IV BOLUS
1000.0000 mL | Freq: Once | INTRAVENOUS | Status: AC
  Administered 2021-12-11: 1000 mL via INTRAVENOUS

## 2021-12-11 MED ORDER — DEXAMETHASONE SODIUM PHOSPHATE 10 MG/ML IJ SOLN
10.0000 mg | Freq: Once | INTRAMUSCULAR | Status: AC
  Administered 2021-12-11: 10 mg via INTRAVENOUS
  Filled 2021-12-11: qty 1

## 2021-12-11 MED ORDER — DIPHENHYDRAMINE HCL 50 MG/ML IJ SOLN
25.0000 mg | Freq: Once | INTRAMUSCULAR | Status: AC
  Administered 2021-12-11: 25 mg via INTRAVENOUS
  Filled 2021-12-11: qty 1

## 2021-12-11 MED ORDER — PROCHLORPERAZINE EDISYLATE 10 MG/2ML IJ SOLN
10.0000 mg | Freq: Once | INTRAMUSCULAR | Status: AC
  Administered 2021-12-11: 10 mg via INTRAVENOUS
  Filled 2021-12-11: qty 2
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
## 2021-12-11 NOTE — ED Triage Notes (Signed)
Pt to ED from home c/o headache for 1 month

## 2021-12-11 NOTE — Discharge Instructions (Addendum)
You may take ibuprofen and/your acetaminophen as needed for your headache.  Please note that if you combine the 2 medications, you will get better pain relief then by taking either medication by itself.  Please follow-up with the neurologist.  You will need to be on medication to try to prevent migraine headaches.  Please try to quit smoking entirely.  Smoking can make migraine headaches worse.  And may take several weeks or even several months to see the benefit of not smoking, but the benefit is significant.

## 2022-08-15 IMAGING — DX DG CHEST 2V
2 series · 2 of 2 positions shown · non-contrast
Comparison: 08/05/2019

CLINICAL DATA: Cough and chest pain

EXAM:
CHEST - 2 VIEW

[chest lat]
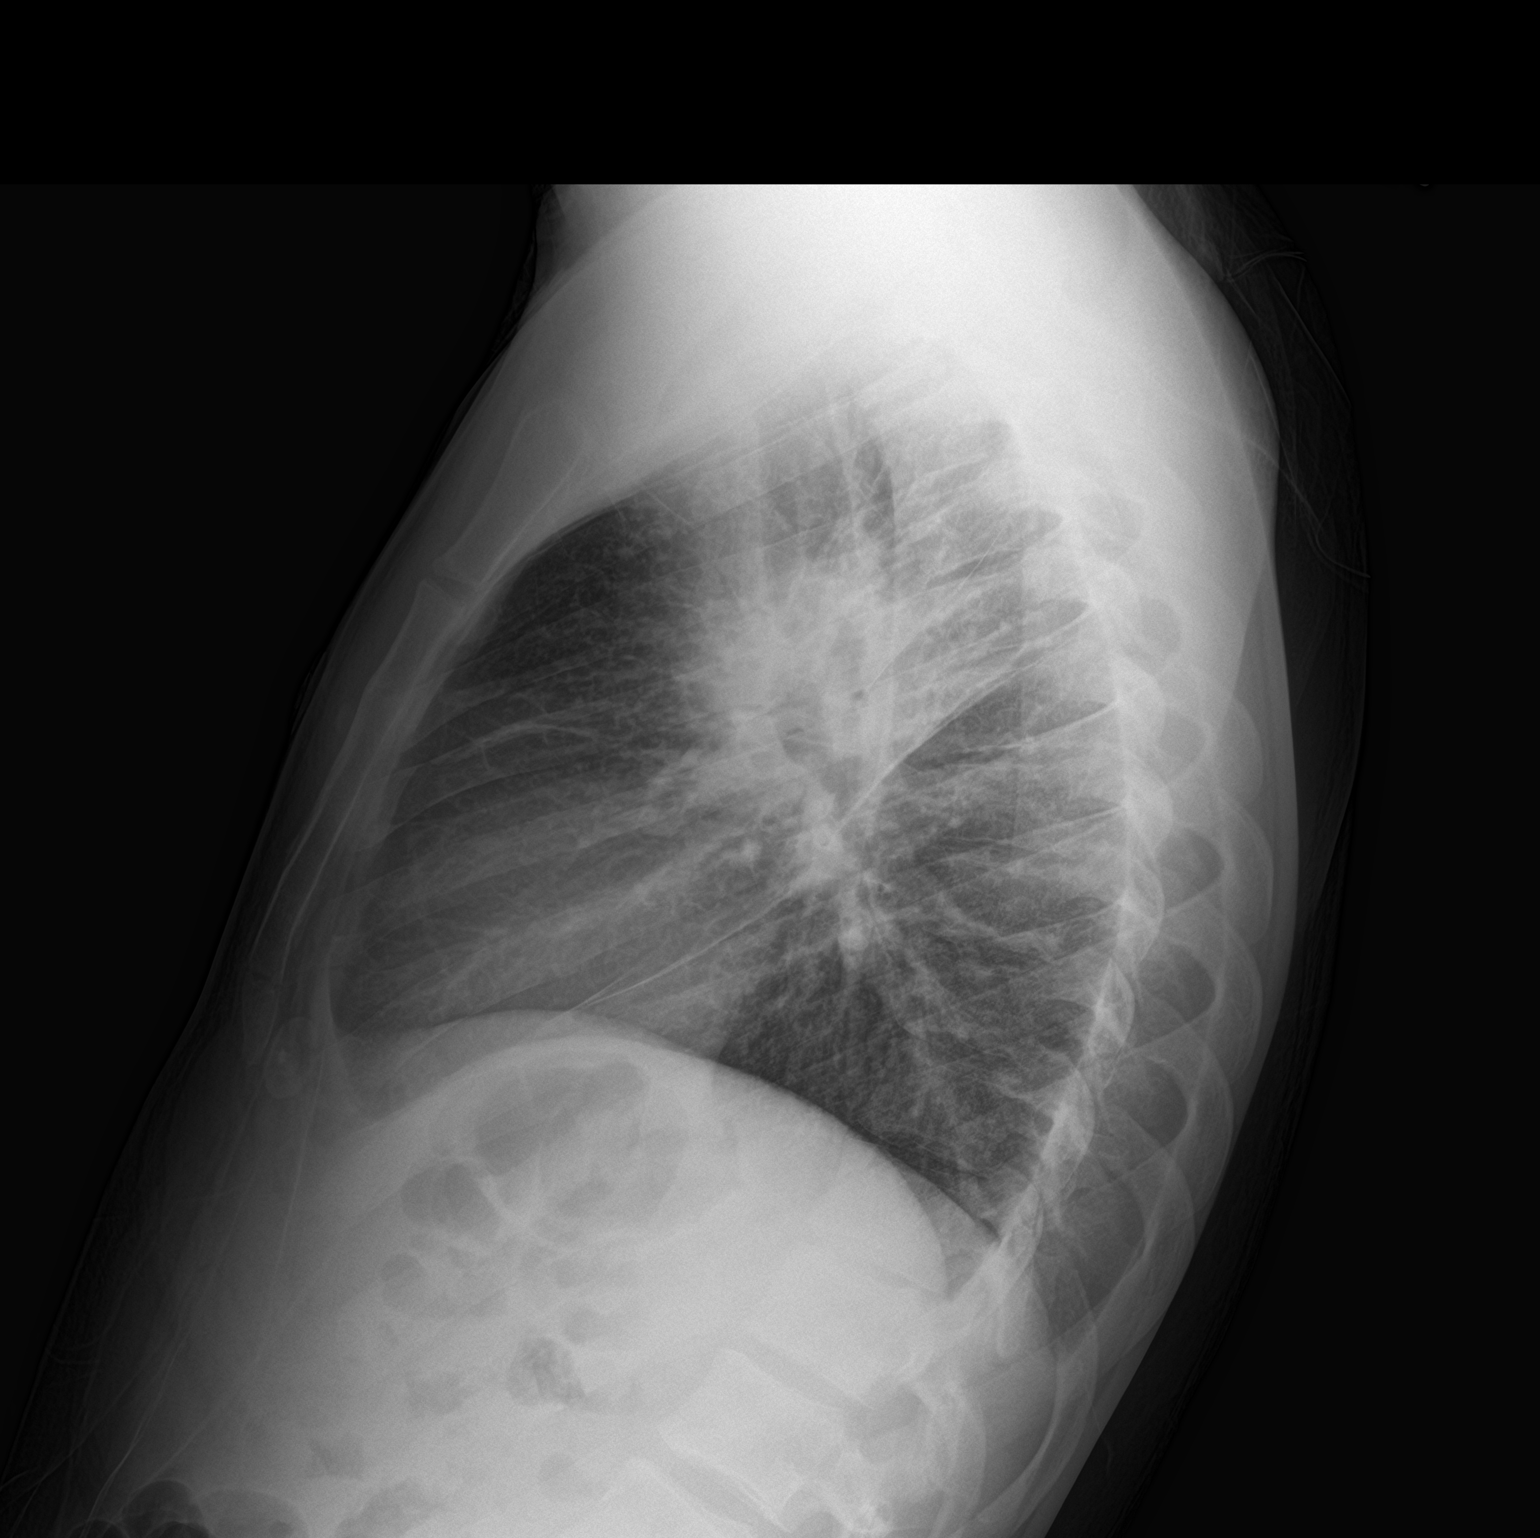

[chest ap]
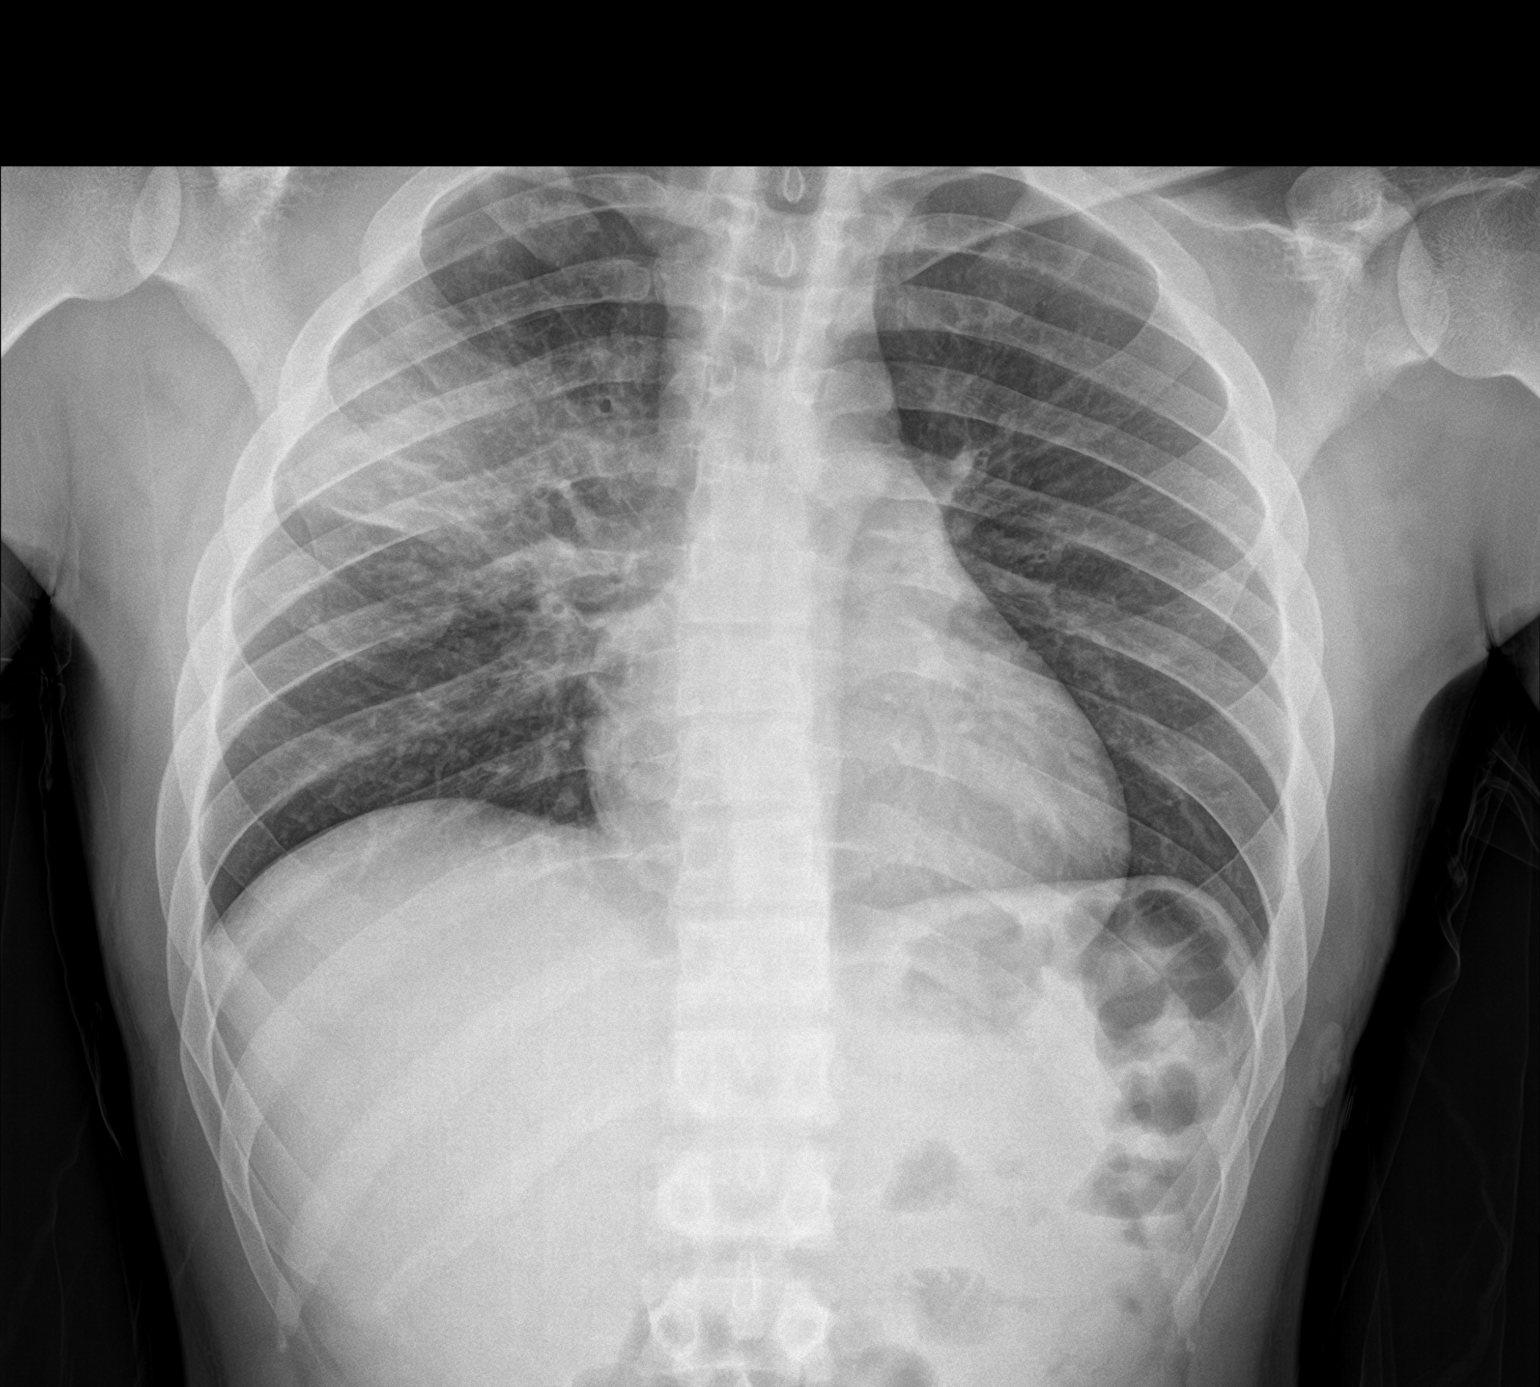

[2 of 2 positions shown; findings below may reference images not displayed]

FINDINGS: Right upper lobe pneumonia. No pleural effusion. Normal heart size.
No pneumothorax.
IMPRESSION: Right upper lobe pneumonia.

## 2022-10-12 ENCOUNTER — Encounter (HOSPITAL_COMMUNITY): Payer: Self-pay

## 2022-10-12 ENCOUNTER — Other Ambulatory Visit: Payer: Self-pay

## 2022-10-12 ENCOUNTER — Emergency Department (HOSPITAL_COMMUNITY)
Admission: EM | Admit: 2022-10-12 | Discharge: 2022-10-12 | Disposition: A | Discharge status: Home / Self Care | Payer: Self-pay | Attending: Emergency Medicine | Admitting: Emergency Medicine

## 2022-10-12 DIAGNOSIS — J020 Streptococcal pharyngitis: Secondary | ICD-10-CM | POA: Insufficient documentation

## 2022-10-12 LAB — GROUP A STREP BY PCR: Group A Strep by PCR: NOT DETECTED

## 2022-10-12 MED ORDER — AMOXICILLIN 500 MG PO CAPS: 500.0000 mg | ORAL_CAPSULE | Freq: Three times a day (TID) | ORAL | 0 refills | Status: DC

## 2022-10-12 MED ORDER — AMOXICILLIN 250 MG PO CAPS
500.0000 mg | ORAL_CAPSULE | Freq: Once | ORAL | Status: AC
  Administered 2022-10-12: 500 mg via ORAL
  Filled 2022-10-12: qty 2

## 2022-10-12 MED ORDER — AMOXICILLIN 500 MG PO CAPS: 500.0000 mg | ORAL_CAPSULE | Freq: Three times a day (TID) | ORAL | 0 refills | Status: AC

## 2022-10-12 NOTE — ED Triage Notes (Signed)
Pt reports sore throat that started this morning.  Says had a cold recently but now c/o sore throat. Says has noticed white patches on the back of his mouth.

## 2022-10-12 NOTE — ED Provider Notes (Signed)
[facility] EMERGENCY DEPARTMENT Provider Note   CSN: [id number] Arrival date & time: 10/12/22  1835     History  Chief Complaint  Patient presents with   Sore Throat   HPI Donald Miranda is a 26 y.o. male presenting for sore throat.  Started yesterday and has progressively worsened.  Sore throat is all day long.  Denies fever and sick contacts.  States that he did have a "cold" last week.  Denies cough.  Denies trouble swallowing and drooling.   Sore Throat       Home Medications Prior to Admission medications   Medication Sig Start Date End Date Taking? Authorizing Provider  amoxicillin (AMOXIL) 500 MG capsule Take 1 capsule (500 mg total) by mouth 3 (three) times daily. 10/12/22  Yes Gareth Eagle, PA-C  ibuprofen (ADVIL) 800 MG tablet Take 1 tablet (800 mg total) by mouth every 8 (eight) hours as needed. 10/10/20   Vickki Hearing, MD  meloxicam (MOBIC) 7.5 MG tablet Take 1 tablet (7.5 mg total) by mouth in the morning and at bedtime. 02/27/21   Vickki Hearing, MD      Allergies    Patient has no known allergies.    Review of Systems   Review of Systems  HENT:  Positive for sore throat.     Physical Exam Updated Vital Signs BP 114/61 (BP Location: Left Arm)   Pulse 90   Temp 98.8 F (37.1 C) (Oral)   Resp 18   Ht 6' (1.829 m)   Wt 86.2 kg   SpO2 99%   BMI 25.77 kg/m  Physical Exam Vitals and nursing note reviewed.  HENT:     Head: Normocephalic and atraumatic.     Mouth/Throat:     Mouth: Mucous membranes are moist.     Pharynx: Uvula midline. Pharyngeal swelling, oropharyngeal exudate and posterior oropharyngeal erythema present.     Tonsils: Tonsillar exudate present. No tonsillar abscesses.  Eyes:     General:        Right eye: No discharge.        Left eye: No discharge.     Conjunctiva/sclera: Conjunctivae normal.  Cardiovascular:     Rate and Rhythm: Normal rate and regular rhythm.     Pulses: Normal pulses.     Heart  sounds: Normal heart sounds.  Pulmonary:     Effort: Pulmonary effort is normal.     Breath sounds: Normal breath sounds.  Abdominal:     General: Abdomen is flat.     Palpations: Abdomen is soft.  Skin:    General: Skin is warm and dry.  Neurological:     General: No focal deficit present.  Psychiatric:        Mood and Affect: Mood normal.     ED Results / Procedures / Treatments   Labs (all labs ordered are listed, but only abnormal results are displayed) Labs Reviewed  GROUP A STREP BY PCR    EKG None  Radiology No results found.  Procedures Procedures    Medications Ordered in ED Medications  amoxicillin (AMOXIL) capsule 500 mg (has no administration in time range)    ED Course/ Medical Decision Making/ A&P                           Medical Decision Making  Patient presenting for sore throat.  On exam, posterior pharynx and uvula were erythematous, swollen.  Also visualized tonsillar exudate.  Rapid strep was negative but given the clinical findings along with no cough and subjective fever at home.  Decided to go ahead and treat for strep pharyngitis.  Did consider tonsillar abscess but unlikely given no evidence on exam.  Treated strep throat with amoxicillin.  Advised patient to follow-up with PCP if symptoms persisted.  Discussed return precautions.        Final Clinical Impression(s) / ED Diagnoses Final diagnoses:  Strep throat    Rx / DC Orders ED Discharge Orders          Ordered    amoxicillin (AMOXIL) 500 MG capsule  3 times daily        10/12/22 2038              Gareth Eagle, PA-C 10/12/22 2042    Bethann Berkshire, MD 10/13/22 1056

## 2022-10-12 NOTE — Discharge Instructions (Addendum)
Evaluation for your sore throat likely do have strep throat.  Recommend that you take amoxicillin for the next 10 days.  Advised that you follow-up with your PCP if your symptoms persist.  If you have new trouble swallowing, drooling, and/or neck pain fever please return to the emergency department for further evaluation.

## 2022-11-03 ENCOUNTER — Encounter (HOSPITAL_COMMUNITY): Payer: Self-pay

## 2022-11-03 ENCOUNTER — Other Ambulatory Visit: Payer: Self-pay

## 2022-11-03 ENCOUNTER — Emergency Department (HOSPITAL_COMMUNITY)
Admission: EM | Admit: 2022-11-03 | Discharge: 2022-11-04 | Discharge status: Against Medical Advice | Payer: Self-pay | Attending: Emergency Medicine | Admitting: Emergency Medicine

## 2022-11-03 DIAGNOSIS — H02843 Edema of right eye, unspecified eyelid: Secondary | ICD-10-CM | POA: Insufficient documentation

## 2022-11-03 DIAGNOSIS — Z5321 Procedure and treatment not carried out due to patient leaving prior to being seen by health care provider: Secondary | ICD-10-CM | POA: Insufficient documentation

## 2022-11-03 NOTE — ED Triage Notes (Signed)
Pt presents with itching and swelling around R eye that started this evening. Denies eye drainage.

## 2023-01-31 ENCOUNTER — Encounter: Payer: Self-pay | Admitting: Radiology

## 2023-12-09 IMAGING — CT CT HEAD W/O CM
2 of 7 series · 10 of 47 positions shown, 12 images · non-contrast
Comparison: None.

CLINICAL DATA: Headache, chronic, new features or increased
frequency

EXAM:
CT HEAD WITHOUT CONTRAST
TECHNIQUE: Contiguous axial images were obtained from the base of the skull
through the vertex without intravenous contrast.

[Series 2: head w o · axial · 0.49mm/px · z∈[+27,+172]mm · 7 of 35 slices shown, 9 images]
[im 3/35  brain]
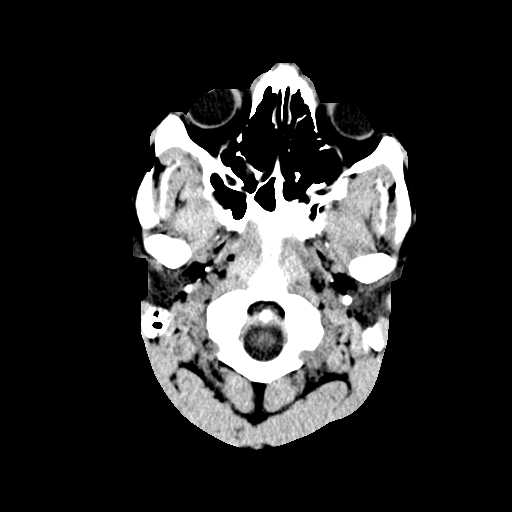
[im 3/35  bone]
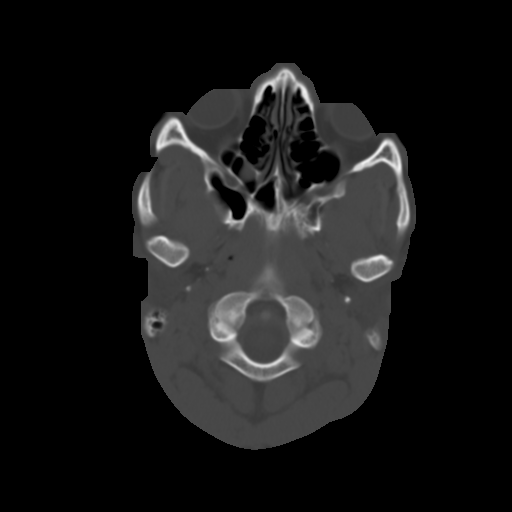
[im 8/35  brain]
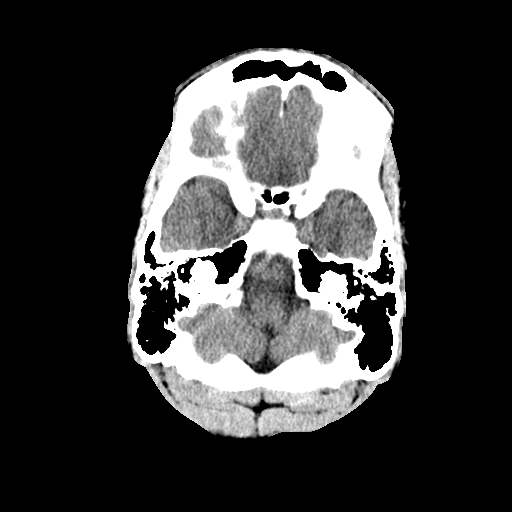
[im 13/35  brain]
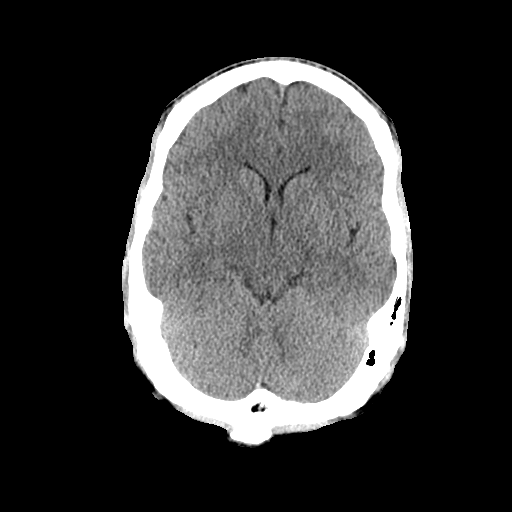
[im 18/35  brain]
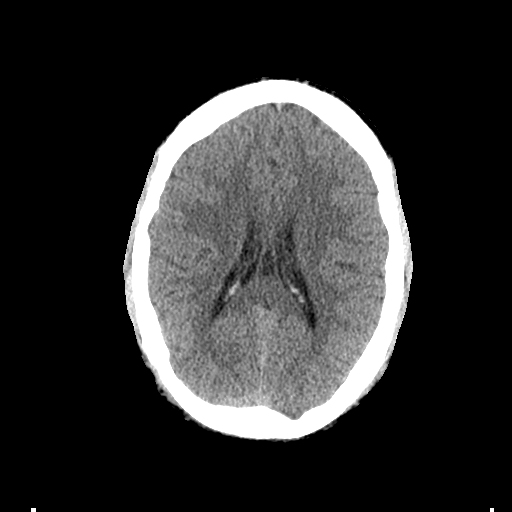
[im 22/35  brain]
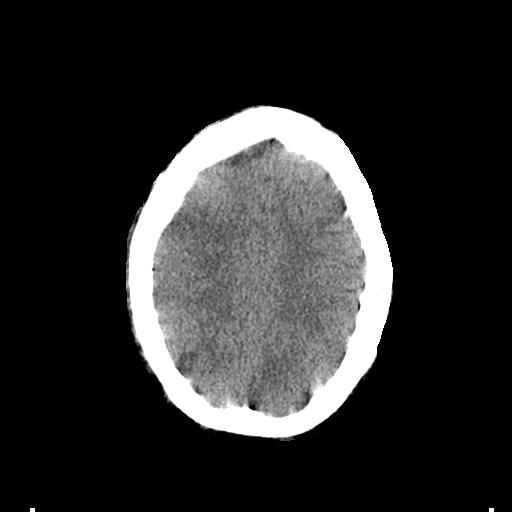
[im 22/35  bone]
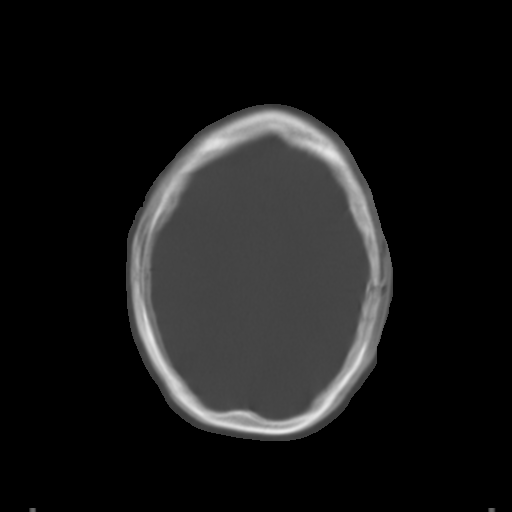
[im 27/35  brain]
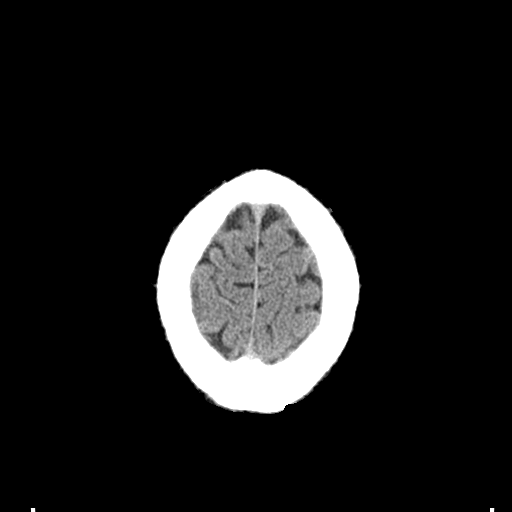
[im 32/35  brain]
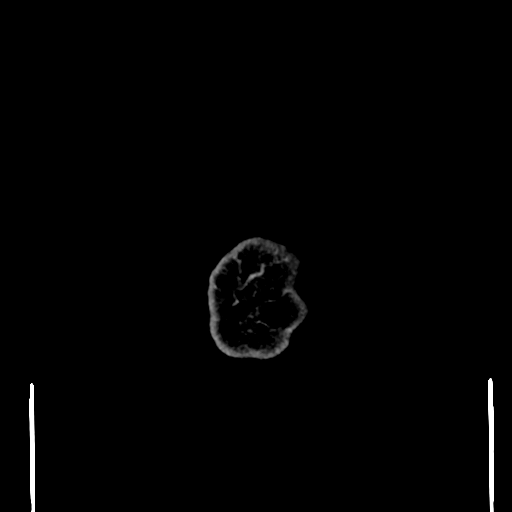

[Series 8: coronal soft · coronal · 0.11mm/px · 3 of 73 slices shown]
[im 19/73  brain]
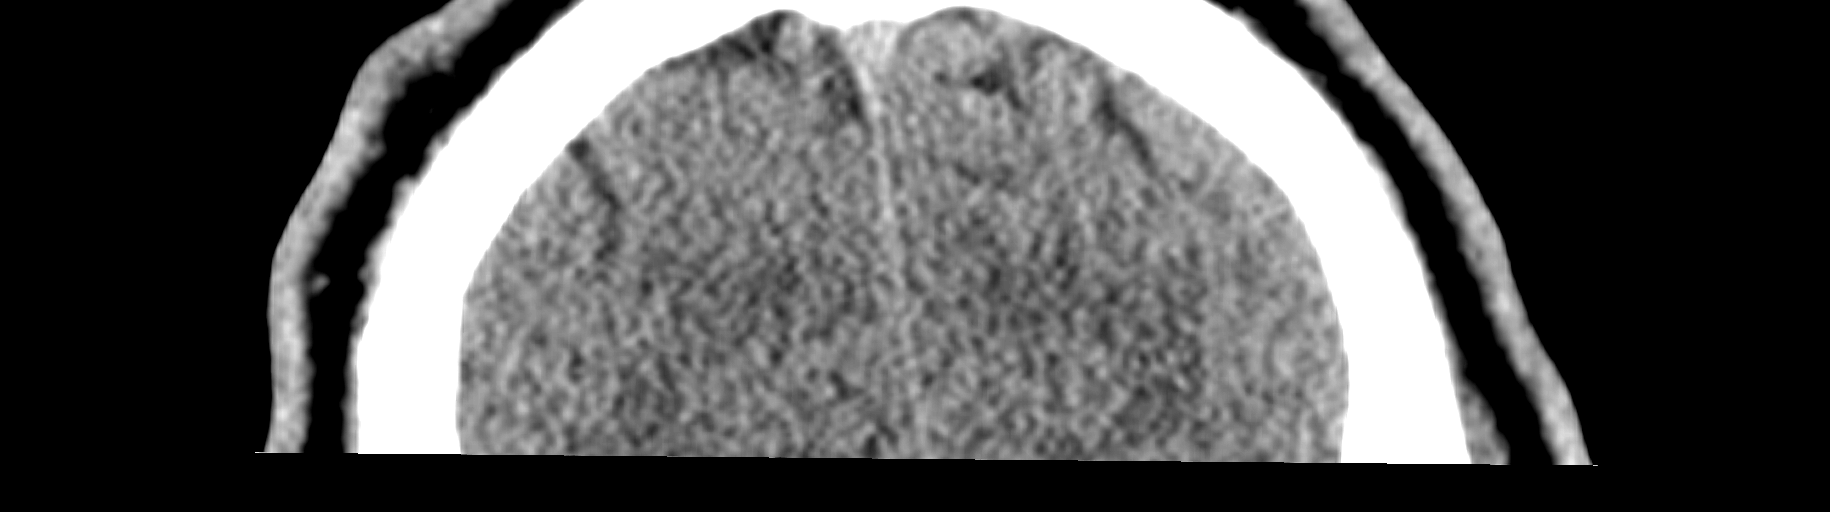
[im 37/73  brain]
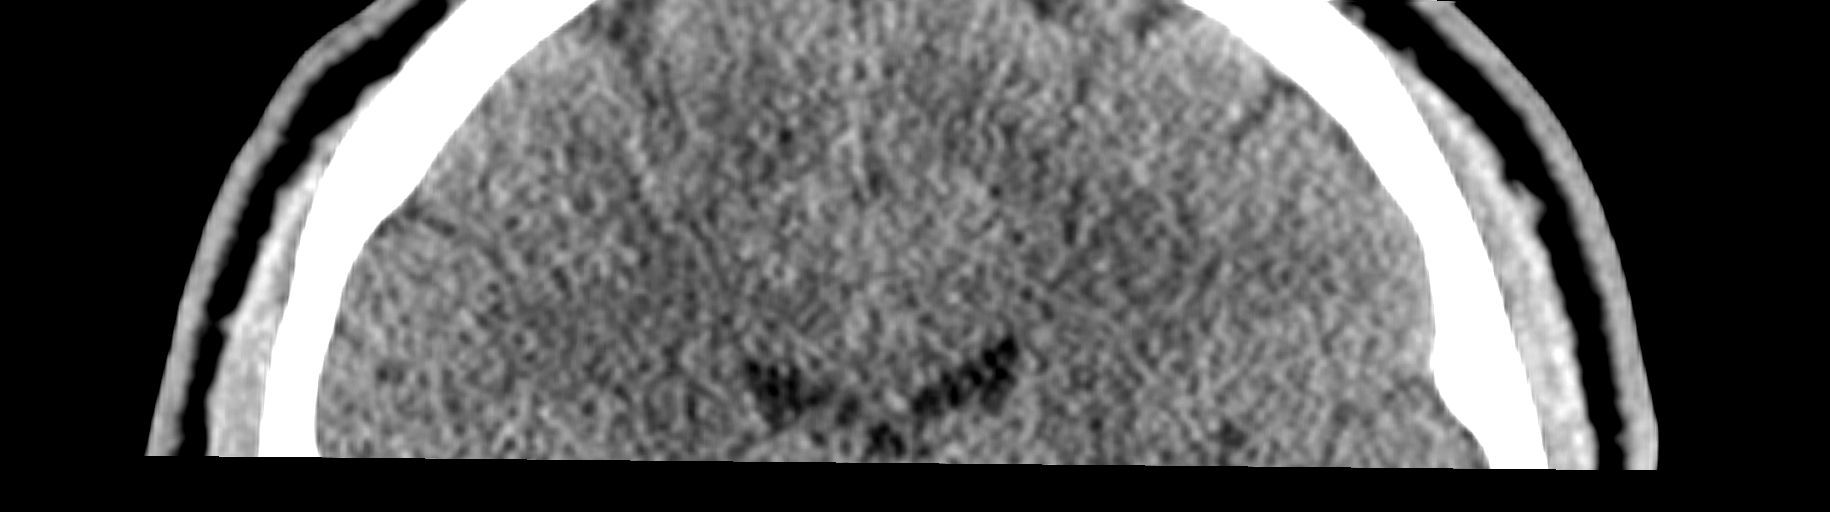
[im 55/73  brain]
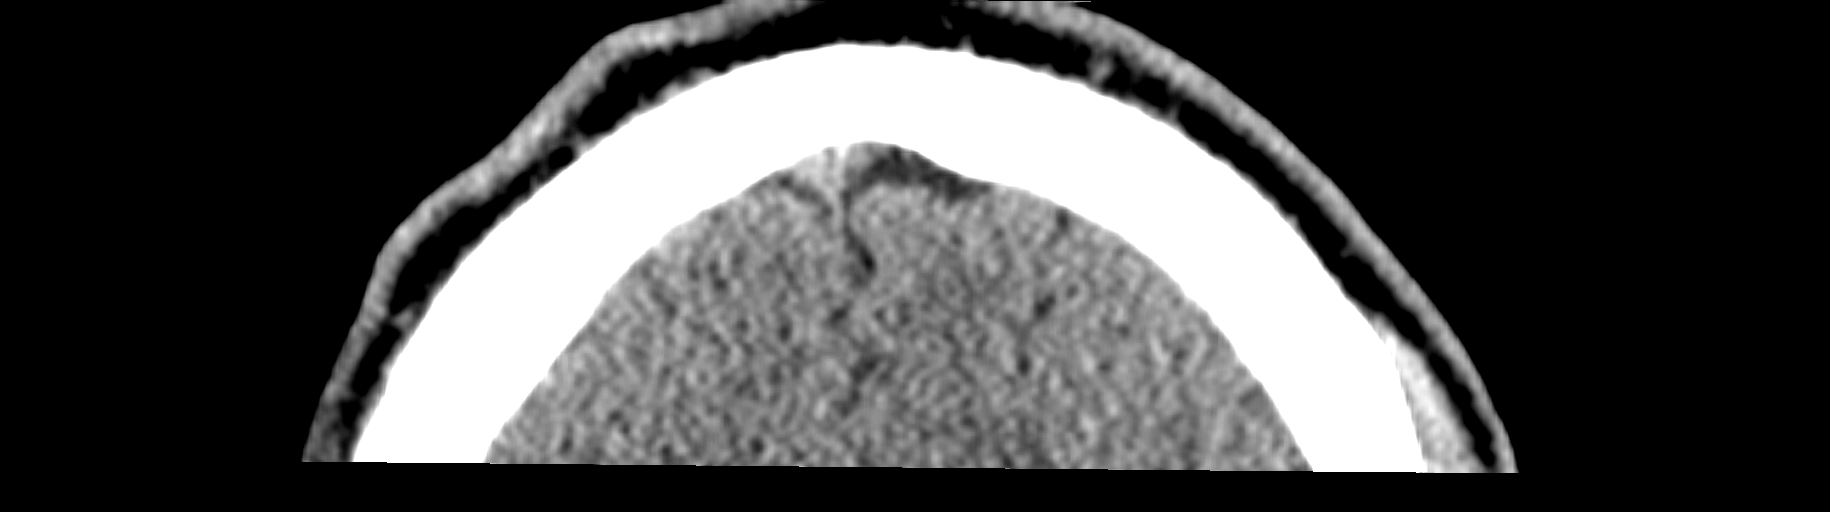

[10 of 47 positions shown; findings below may reference images not displayed]

FINDINGS: Brain: No acute intracranial abnormality. Specifically, no
hemorrhage, hydrocephalus, mass lesion, acute infarction, or
significant intracranial injury.

Vascular: No hyperdense vessel or unexpected calcification.

Skull: No acute calvarial abnormality.

Sinuses/Orbits: No acute findings

Other: None
IMPRESSION: No acute intracranial abnormality.

## 2023-12-26 ENCOUNTER — Ambulatory Visit
Admission: EM | Admit: 2023-12-26 | Discharge: 2023-12-26 | Disposition: A | Discharge status: Home / Self Care | Payer: Self-pay

## 2023-12-26 DIAGNOSIS — G43909 Migraine, unspecified, not intractable, without status migrainosus: Secondary | ICD-10-CM
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
## 2023-12-26 NOTE — Discharge Instructions (Signed)
This sounds like you are having migraines.  Recommend continuing Tylenol 500 to 1000 mg every 6 hours or ibuprofen 800 mg every 8 hours as needed for the migraine.  If you have an acute migraine, please feel free to return here so we can treat it.  I also recommend establishing care with a primary care provider for consideration of migraine prevention medicine.

## 2023-12-26 NOTE — ED Triage Notes (Signed)
Pt reports he has been having a headache for x 3 weeks. States they are not constant.  Took bc and excedrine  States he has a history of migraines
